# Patient Record
Sex: Female | Born: 1985
Health system: Southern US, Community
[De-identification: ages and names within clinical notes are randomized; demographics above are authoritative.]

## PROBLEM LIST (undated history)

## (undated) ENCOUNTER — Inpatient Hospital Stay (HOSPITAL_COMMUNITY): Payer: Self-pay

## (undated) DIAGNOSIS — D509 Iron deficiency anemia, unspecified: Secondary | ICD-10-CM

## (undated) DIAGNOSIS — J45909 Unspecified asthma, uncomplicated: Secondary | ICD-10-CM

## (undated) DIAGNOSIS — N390 Urinary tract infection, site not specified: Secondary | ICD-10-CM

## (undated) DIAGNOSIS — K219 Gastro-esophageal reflux disease without esophagitis: Secondary | ICD-10-CM

## (undated) DIAGNOSIS — Z8616 Personal history of COVID-19: Secondary | ICD-10-CM

## (undated) HISTORY — DX: Iron deficiency anemia, unspecified: D50.9

## (undated) HISTORY — DX: Gastro-esophageal reflux disease without esophagitis: K21.9

## (undated) HISTORY — DX: Unspecified asthma, uncomplicated: J45.909

## (undated) HISTORY — PX: WISDOM TOOTH EXTRACTION: SHX21

## (undated) HISTORY — DX: Personal history of COVID-19: Z86.16

---
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
## 2009-10-15 ENCOUNTER — Emergency Department (HOSPITAL_COMMUNITY): Admission: EM | Admit: 2009-10-15 | Discharge: 2009-10-15 | Payer: Self-pay | Admitting: Emergency Medicine

## 2010-04-08 ENCOUNTER — Inpatient Hospital Stay (HOSPITAL_COMMUNITY): Admission: AD | Admit: 2010-04-08 | Discharge: 2010-04-08 | Payer: Self-pay | Admitting: Obstetrics & Gynecology

## 2010-06-04 ENCOUNTER — Ambulatory Visit: Payer: Self-pay | Admitting: Nurse Practitioner

## 2010-06-04 ENCOUNTER — Inpatient Hospital Stay (HOSPITAL_COMMUNITY)
Admission: AD | Admit: 2010-06-04 | Discharge: 2010-06-04 | Payer: Self-pay | Source: Home / Self Care | Admitting: Obstetrics and Gynecology

## 2010-08-17 ENCOUNTER — Inpatient Hospital Stay (HOSPITAL_COMMUNITY)
Admission: AD | Admit: 2010-08-17 | Discharge: 2010-08-18 | Payer: Self-pay | Source: Home / Self Care | Attending: Obstetrics and Gynecology | Admitting: Obstetrics and Gynecology

## 2010-08-26 LAB — CBC
HCT: 35.8 % — ABNORMAL LOW (ref 36.0–46.0)
Hemoglobin: 12.1 g/dL (ref 12.0–15.0)
MCH: 24.3 pg — ABNORMAL LOW (ref 26.0–34.0)
MCHC: 33.8 g/dL (ref 30.0–36.0)
MCV: 72 fL — ABNORMAL LOW (ref 78.0–100.0)
Platelets: 149 10*3/uL — ABNORMAL LOW (ref 150–400)
RBC: 4.97 MIL/uL (ref 3.87–5.11)
RDW: 15.4 % (ref 11.5–15.5)
WBC: 14.1 10*3/uL — ABNORMAL HIGH (ref 4.0–10.5)

## 2010-10-22 ENCOUNTER — Other Ambulatory Visit: Payer: Self-pay | Admitting: Obstetrics and Gynecology

## 2010-10-23 LAB — URINALYSIS, ROUTINE W REFLEX MICROSCOPIC
Bilirubin Urine: NEGATIVE
Glucose, UA: NEGATIVE mg/dL
Hgb urine dipstick: NEGATIVE
Ketones, ur: NEGATIVE mg/dL
Nitrite: NEGATIVE
Protein, ur: NEGATIVE mg/dL
Specific Gravity, Urine: 1.02 (ref 1.005–1.030)
Urobilinogen, UA: 0.2 mg/dL (ref 0.0–1.0)
pH: 7 (ref 5.0–8.0)

## 2010-10-23 LAB — CBC
HCT: 36.5 % (ref 36.0–46.0)
Hemoglobin: 12 g/dL (ref 12.0–15.0)
MCH: 25.3 pg — ABNORMAL LOW (ref 26.0–34.0)
MCHC: 32.8 g/dL (ref 30.0–36.0)
MCV: 76.9 fL — ABNORMAL LOW (ref 78.0–100.0)
Platelets: 205 10*3/uL (ref 150–400)
RBC: 4.74 MIL/uL (ref 3.87–5.11)
RDW: 15.1 % (ref 11.5–15.5)
WBC: 11.8 10*3/uL — ABNORMAL HIGH (ref 4.0–10.5)

## 2010-10-23 LAB — URINE MICROSCOPIC-ADD ON

## 2010-10-23 LAB — WET PREP, GENITAL
Clue Cells Wet Prep HPF POC: NONE SEEN
Trich, Wet Prep: NONE SEEN
Yeast Wet Prep HPF POC: NONE SEEN

## 2010-10-23 LAB — GC/CHLAMYDIA PROBE AMP, GENITAL
Chlamydia, DNA Probe: NEGATIVE
GC Probe Amp, Genital: NEGATIVE

## 2010-10-23 LAB — HCG, QUANTITATIVE, PREGNANCY: hCG, Beta Chain, Quant, S: 42726 m[IU]/mL — ABNORMAL HIGH (ref <5)

## 2010-10-23 LAB — ABO/RH: ABO/RH(D): O POS

## 2010-11-05 ENCOUNTER — Inpatient Hospital Stay (HOSPITAL_COMMUNITY)
Admission: AD | Admit: 2010-11-05 | Discharge: 2010-11-07 | DRG: 372 | Disposition: A | Discharge status: Home / Self Care | Payer: BC Managed Care – PPO | Source: Ambulatory Visit | Attending: Obstetrics and Gynecology | Admitting: Obstetrics and Gynecology

## 2010-11-05 DIAGNOSIS — O4290 Premature rupture of membranes, unspecified as to length of time between rupture and onset of labor, unspecified weeks of gestation: Secondary | ICD-10-CM | POA: Diagnosis present

## 2010-11-05 LAB — CBC
HCT: 36.6 % (ref 36.0–46.0)
Hemoglobin: 12.1 g/dL (ref 12.0–15.0)
MCH: 23.6 pg — ABNORMAL LOW (ref 26.0–34.0)
MCHC: 33.1 g/dL (ref 30.0–36.0)
MCV: 71.5 fL — ABNORMAL LOW (ref 78.0–100.0)
Platelets: 203 10*3/uL (ref 150–400)
RBC: 5.12 MIL/uL — ABNORMAL HIGH (ref 3.87–5.11)
RDW: 15.8 % — ABNORMAL HIGH (ref 11.5–15.5)
WBC: 13.5 10*3/uL — ABNORMAL HIGH (ref 4.0–10.5)

## 2010-11-05 LAB — RPR: RPR Ser Ql: NONREACTIVE

## 2010-11-06 LAB — CBC
HCT: 33.7 % — ABNORMAL LOW (ref 36.0–46.0)
Hemoglobin: 11.2 g/dL — ABNORMAL LOW (ref 12.0–15.0)
MCH: 23.6 pg — ABNORMAL LOW (ref 26.0–34.0)
MCHC: 33.2 g/dL (ref 30.0–36.0)
MCV: 70.9 fL — ABNORMAL LOW (ref 78.0–100.0)
Platelets: 179 10*3/uL (ref 150–400)
RBC: 4.75 MIL/uL (ref 3.87–5.11)
RDW: 15.8 % — ABNORMAL HIGH (ref 11.5–15.5)
WBC: 18.1 10*3/uL — ABNORMAL HIGH (ref 4.0–10.5)

## 2010-11-08 NOTE — Discharge Summary (Signed)
  NAMERACHAEL, Emily Mason                ACCOUNT NO.:  [id number]  MEDICAL RECORD NO.:  [id number]           PATIENT TYPE:  I  LOCATION:  9133                          FACILITY:  [facility]  PHYSICIAN:  Gerrit Friends. Aldona Bar, M.D.   DATE OF BIRTH:  1985-09-05  DATE OF ADMISSION:  11/05/2010 DATE OF DISCHARGE:  11/07/2010                              DISCHARGE SUMMARY   DISCHARGE DIAGNOSES: 1. A 37-week intrauterine pregnancy, delivered 7 pounds 15 ounces female     infant, Apgars 8 and 9. 2. Blood type O positive. 3. Obesity.  PROCEDURES: 1. Vacuum extraction assisted delivery. 2. Third-degree tear and repair.  SUMMARY:  This 25 year old gravida 2, para 0 with a due date of November 25, 2010, was admitted after ruptured membranes and a relatively uncomplicated pregnancy.  She had her labor augmented and progressed, but during the second stage because of some persistent bradycardia, required a vacuum extraction assisted delivery and was delivered viablemale infant weighing 7 pounds 15 ounces with Apgars of 8 and 9, however, third-degree tear which was repaired without difficulty.  Her postpartum course was benign.  Her discharge hemoglobin was 11.2 with a white count of 18,100, and her platelet count of 179,000.  On the morning of November 07, 2010, she was ambulating well, tolerating a regular diet well, having normal bowel and bladder function, was afebrile.  Her breast- feeding was going well.  Her vital signs were stable and she was desirous of discharge.  Accordingly, she was given all appropriate instructions per discharge brochure and understood all instructions well.  Discharge medications include vitamins 1 a day and she was given prescriptions for Motrin 600 mg to use every 6 hours as needed for cramping or mild pain and Tylox 1-2 every 4-6 hours as needed for more severe pain.  She will return to the office in 4 weeks' time for followup.  She did not desire circumcision for the  baby.  CONDITION ON DISCHARGE:  Improved.     Gerrit Friends. Aldona Bar, M.D.     RMW/MEDQ  D:  11/07/2010  T:  11/07/2010  Job:  [id number]  Electronically Signed by Annamaria Helling M.D. on 11/08/2010 09:04:44 AM

## 2010-12-04 ENCOUNTER — Other Ambulatory Visit: Payer: Self-pay | Admitting: Obstetrics & Gynecology

## 2011-07-26 ENCOUNTER — Emergency Department (INDEPENDENT_AMBULATORY_CARE_PROVIDER_SITE_OTHER)
Admission: EM | Admit: 2011-07-26 | Discharge: 2011-07-26 | Disposition: A | Discharge status: Home / Self Care | Payer: BC Managed Care – PPO | Source: Home / Self Care | Attending: Family Medicine | Admitting: Family Medicine

## 2011-07-26 DIAGNOSIS — R6889 Other general symptoms and signs: Secondary | ICD-10-CM

## 2011-07-26 MED ORDER — ACETAMINOPHEN 325 MG PO TABS
ORAL_TABLET | ORAL | Status: AC
  Filled 2011-07-26: qty 3

## 2011-07-26 MED ORDER — ONDANSETRON HCL 4 MG PO TABS: 4.0000 mg | ORAL_TABLET | Freq: Three times a day (TID) | ORAL | Status: AC | PRN

## 2011-07-26 MED ORDER — IBUPROFEN 600 MG PO TABS: 600.0000 mg | ORAL_TABLET | Freq: Three times a day (TID) | ORAL | Status: AC | PRN

## 2011-07-26 MED ORDER — HYDROCODONE-ACETAMINOPHEN 7.5-325 MG/15ML PO SOLN: 15.0000 mL | Freq: Three times a day (TID) | ORAL | Status: AC | PRN

## 2011-07-26 MED ORDER — ACETAMINOPHEN 500 MG PO TABS
1000.0000 mg | ORAL_TABLET | Freq: Once | ORAL | Status: AC
  Administered 2011-07-26: 1000 mg via ORAL

## 2011-07-26 NOTE — ED Notes (Signed)
Pt is breastfeeding

## 2011-07-26 NOTE — ED Notes (Signed)
Pt has fever, cough, congestion and bodyaches since 07-24-11

## 2011-07-26 NOTE — ED Provider Notes (Signed)
History     CSN: [id number] Arrival date & time: 07/26/2011  9:25 AM   First MD Initiated Contact with Patient 07/26/11 [phone]      Chief Complaint  Patient presents with  . Fever    (Consider location/radiation/quality/duration/timing/severity/associated sxs/prior treatment) HPI Comments: No significant PMH 8 months post-partum. Here c/o fever, body aches cogh congestion for 2 days. Has had nausea, no vomitting or diarrhea. Decreased apetite but drinking fluids well, no chest pain.     History reviewed. No pertinent past medical history.  Past Surgical History  Procedure Date  . Wisdom tooth extraction     History reviewed. No pertinent family history.  History  Substance Use Topics  . Smoking status: Never Smoker   . Smokeless tobacco: Not on file  . Alcohol Use: No    OB History    Grav Para Term Preterm Abortions TAB SAB Ect Mult Living                  Review of Systems  Constitutional: Positive for fever, chills and appetite change.  HENT: Positive for congestion and rhinorrhea. Negative for sore throat, neck pain, neck stiffness and sinus pressure.   Eyes: Negative for discharge.  Respiratory: Positive for cough. Negative for chest tightness, shortness of breath and wheezing.   Gastrointestinal: Positive for nausea. Negative for vomiting, abdominal pain and diarrhea.  Genitourinary: Negative for dysuria, frequency and hematuria.  Musculoskeletal: Positive for myalgias.  Skin: Negative for rash.  Neurological: Positive for headaches.    Allergies  Review of patient's allergies indicates no known allergies.  Home Medications   Current Outpatient Rx  Name Route Sig Dispense Refill  . ASPIRIN EFFERVESCENT 325 MG PO TBEF Oral Take 325 mg by mouth every 6 (six) hours as needed.      Marland Kitchen HYDROCODONE-ACETAMINOPHEN 7.5-325 MG/15ML PO SOLN Oral Take 15 mLs by mouth every 8 (eight) hours as needed for pain (or cough). 120 mL 0  . IBUPROFEN 600 MG PO TABS Oral  Take 1 tablet (600 mg total) by mouth every 8 (eight) hours as needed for pain or fever. 20 tablet 0    Take with food  . ONDANSETRON HCL 4 MG PO TABS Oral Take 1 tablet (4 mg total) by mouth every 8 (eight) hours as needed for nausea. 10 tablet 0    BP 134/99  Pulse 131  Temp(Src) 100.6 F (38.1 C) (Oral)  Resp 18  SpO2 99%  LMP 07/13/2011  Physical Exam  Nursing note and vitals reviewed. Constitutional: She is oriented to person, place, and time. She appears well-developed and well-nourished. No distress.  HENT:  Head: Normocephalic and atraumatic.  Mouth/Throat: No oropharyngeal exudate.       Nasal Congestion with erythema and swelling of nasal turbinates, clear rhinorrhea. Pharyngeal erythema no exudates. No uvula deviation. No trismus. TM's normal.  Eyes:       Bilateral conjunctival injection, no blepharitis or discharge.   Neck: Neck supple.  Cardiovascular: Normal rate, regular rhythm and normal heart sounds.   No murmur heard. Pulmonary/Chest: Effort normal and breath sounds normal. No respiratory distress. She has no wheezes. She has no rales. She exhibits no tenderness.  Abdominal: Soft. There is no tenderness.  Musculoskeletal: She exhibits no edema.  Lymphadenopathy:    She has no cervical adenopathy.  Neurological: She is alert and oriented to person, place, and time.  Skin: Skin is warm.    ED Course  Procedures (including critical care time)  Labs Reviewed -  No data to display No results found.   1. Influenza-like illness       MDM          Sharin Grave, MD 07/29/11 [phone]

## 2012-03-27 ENCOUNTER — Encounter (HOSPITAL_COMMUNITY): Payer: Self-pay

## 2012-03-27 ENCOUNTER — Emergency Department (INDEPENDENT_AMBULATORY_CARE_PROVIDER_SITE_OTHER)
Admission: EM | Admit: 2012-03-27 | Discharge: 2012-03-27 | Disposition: A | Discharge status: Home / Self Care | Payer: BC Managed Care – PPO | Source: Home / Self Care

## 2012-03-27 DIAGNOSIS — N898 Other specified noninflammatory disorders of vagina: Secondary | ICD-10-CM

## 2012-03-27 DIAGNOSIS — N390 Urinary tract infection, site not specified: Secondary | ICD-10-CM

## 2012-03-27 LAB — POCT URINALYSIS DIP (DEVICE)
Bilirubin Urine: NEGATIVE
Glucose, UA: NEGATIVE mg/dL
Hgb urine dipstick: NEGATIVE
Ketones, ur: NEGATIVE mg/dL
Nitrite: NEGATIVE
Protein, ur: 30 mg/dL — AB
Specific Gravity, Urine: 1.025 (ref 1.005–1.030)
Urobilinogen, UA: 0.2 mg/dL (ref 0.0–1.0)
pH: 6.5 (ref 5.0–8.0)

## 2012-03-27 LAB — WET PREP, GENITAL
Clue Cells Wet Prep HPF POC: NONE SEEN
Trich, Wet Prep: NONE SEEN

## 2012-03-27 LAB — POCT PREGNANCY, URINE: Preg Test, Ur: NEGATIVE

## 2012-03-27 MED ORDER — CEPHALEXIN 500 MG PO CAPS: 500.0000 mg | ORAL_CAPSULE | Freq: Two times a day (BID) | ORAL | Status: AC

## 2012-03-27 MED ORDER — FLUCONAZOLE 150 MG PO TABS: 150.0000 mg | ORAL_TABLET | Freq: Once | ORAL | Status: AC

## 2012-03-27 NOTE — ED Notes (Signed)
Pt has vaginal itching and burning with thick white discharge.

## 2012-03-27 NOTE — ED Provider Notes (Signed)
History     CSN: [id number]  Arrival date & time 03/27/12  1906   None     Chief Complaint  Patient presents with  . Vaginal Discharge    (Consider location/radiation/quality/duration/timing/severity/associated sxs/prior treatment) Patient is a 26 y.o. female presenting with vaginal discharge. The history is provided by the patient.  Vaginal Discharge  26 y.o. female complains of white thick irritating vaginal discharge for 4 days.  Denies abnormal vaginal bleeding, significant pelvic pain or fever. +dysuria, no flank pain. Sexually active, married, does not use condoms, no change in partner.  Last unprotected intercourse this week.  Denies history of known exposure to STD or symptoms in partner.  Patient's last menstrual period was 03/05/2012.  No history of STD's.  Has not taken medication for symptoms.  History reviewed. No pertinent past medical history.  Past Surgical History  Procedure Date  . Wisdom tooth extraction     History reviewed. No pertinent family history.  History  Substance Use Topics  . Smoking status: Never Smoker   . Smokeless tobacco: Not on file  . Alcohol Use: No    OB History    Grav Para Term Preterm Abortions TAB SAB Ect Mult Living                  Review of Systems  Constitutional: Negative.   Respiratory: Negative.   Cardiovascular: Negative.   Gastrointestinal: Negative.   Genitourinary: Positive for dysuria and vaginal discharge. Negative for urgency, frequency, hematuria, flank pain, decreased urine volume, vaginal bleeding, difficulty urinating, menstrual problem and pelvic pain.  Musculoskeletal: Negative.     Allergies  Review of patient's allergies indicates no known allergies.  Home Medications   Current Outpatient Rx  Name Route Sig Dispense Refill  . ASPIRIN EFFERVESCENT 325 MG PO TBEF Oral Take 325 mg by mouth every 6 (six) hours as needed.      . CEPHALEXIN 500 MG PO CAPS Oral Take 1 capsule (500 mg total) by mouth  2 (two) times daily. 20 capsule 0  . FLUCONAZOLE 150 MG PO TABS Oral Take 1 tablet (150 mg total) by mouth once. Take second dose in 4 days if not improved. 2 tablet 0    BP 151/107  Pulse 79  Temp 97.8 F (36.6 C) (Oral)  Resp 18  SpO2 97%  LMP 03/05/2012  Physical Exam  Nursing note and vitals reviewed. Constitutional: She is oriented to person, place, and time. Vital signs are normal. She appears well-developed and well-nourished. She is active and cooperative.  HENT:  Head: Normocephalic.  Eyes: Conjunctivae are normal. Pupils are equal, round, and reactive to light. No scleral icterus.  Neck: Trachea normal. Neck supple.  Cardiovascular: Normal rate, regular rhythm and normal heart sounds.   Pulmonary/Chest: Effort normal and breath sounds normal.  Abdominal: Soft. Bowel sounds are normal.  Genitourinary: Uterus normal. Pelvic exam was performed with patient supine. No labial fusion. There is no rash, tenderness or lesion on the right labia. There is no rash, tenderness or lesion on the left labia. Cervix exhibits no motion tenderness, no discharge and no friability. Right adnexum displays no mass, no tenderness and no fullness. Left adnexum displays no mass, no tenderness and no fullness. No erythema, tenderness or bleeding around the vagina. No signs of injury around the vagina. Vaginal discharge found.  Lymphadenopathy:    She has no cervical adenopathy.       Right: No inguinal adenopathy present.       Left:  No inguinal adenopathy present.  Neurological: She is alert and oriented to person, place, and time. No cranial nerve deficit or sensory deficit.  Skin: Skin is warm and dry.  Psychiatric: She has a normal mood and affect. Her speech is normal and behavior is normal. Judgment and thought content normal. Cognition and memory are normal.    ED Course  Procedures (including critical care time)  Labs Reviewed  POCT URINALYSIS DIP (DEVICE) - Abnormal; Notable for the  following:    Protein, ur 30 (*)     Leukocytes, UA MODERATE (*)  Biochemical Testing Only. Please order routine urinalysis from main lab if confirmatory testing is needed.   All other components within normal limits  WET PREP, GENITAL - Abnormal; Notable for the following:    Yeast Wet Prep HPF POC FEW YEAST (*)     WBC, Wet Prep HPF POC MANY (*)     All other components within normal limits  POCT PREGNANCY, URINE  GC/CHLAMYDIA PROBE AMP, GENITAL   No results found.   1. UTI (lower urinary tract infection)   2. Vaginal Discharge       MDM  Await GC/CT culture results, take antibiotics as prescribed.          Johnsie Kindred, NP 03/27/12 2129

## 2012-03-28 NOTE — ED Provider Notes (Signed)
Medical screening examination/treatment/procedure(s) were performed by non-physician practitioner and as supervising physician I was immediately available for consultation/collaboration.  Leslee Home, M.D.   Reuben Likes, MD 03/28/12 [phone]

## 2012-03-30 LAB — GC/CHLAMYDIA PROBE AMP, GENITAL
Chlamydia, DNA Probe: NEGATIVE
GC Probe Amp, Genital: NEGATIVE

## 2013-04-25 ENCOUNTER — Inpatient Hospital Stay (HOSPITAL_COMMUNITY): Payer: BC Managed Care – PPO

## 2013-04-25 ENCOUNTER — Inpatient Hospital Stay (HOSPITAL_COMMUNITY)
Admission: AD | Admit: 2013-04-25 | Discharge: 2013-04-25 | Disposition: A | Discharge status: Home / Self Care | Payer: BC Managed Care – PPO | Source: Ambulatory Visit | Attending: Obstetrics and Gynecology | Admitting: Obstetrics and Gynecology

## 2013-04-25 ENCOUNTER — Encounter (HOSPITAL_COMMUNITY): Payer: Self-pay | Admitting: *Deleted

## 2013-04-25 DIAGNOSIS — N831 Corpus luteum cyst of ovary, unspecified side: Secondary | ICD-10-CM | POA: Insufficient documentation

## 2013-04-25 DIAGNOSIS — N888 Other specified noninflammatory disorders of cervix uteri: Secondary | ICD-10-CM

## 2013-04-25 DIAGNOSIS — O34599 Maternal care for other abnormalities of gravid uterus, unspecified trimester: Secondary | ICD-10-CM | POA: Insufficient documentation

## 2013-04-25 DIAGNOSIS — R109 Unspecified abdominal pain: Secondary | ICD-10-CM | POA: Insufficient documentation

## 2013-04-25 DIAGNOSIS — O26851 Spotting complicating pregnancy, first trimester: Secondary | ICD-10-CM

## 2013-04-25 DIAGNOSIS — O26859 Spotting complicating pregnancy, unspecified trimester: Secondary | ICD-10-CM | POA: Insufficient documentation

## 2013-04-25 HISTORY — DX: Urinary tract infection, site not specified: N39.0

## 2013-04-25 LAB — CBC
HCT: 32.8 % — ABNORMAL LOW (ref 36.0–46.0)
MCV: 65.7 fL — ABNORMAL LOW (ref 78.0–100.0)
Platelets: 202 10*3/uL (ref 150–400)
RBC: 4.99 MIL/uL (ref 3.87–5.11)
RDW: 18 % — ABNORMAL HIGH (ref 11.5–15.5)
WBC: 9.9 10*3/uL (ref 4.0–10.5)

## 2013-04-25 LAB — URINALYSIS, ROUTINE W REFLEX MICROSCOPIC
Bilirubin Urine: NEGATIVE
Hgb urine dipstick: NEGATIVE
Ketones, ur: NEGATIVE mg/dL
Nitrite: NEGATIVE
Specific Gravity, Urine: 1.01 (ref 1.005–1.030)
Urobilinogen, UA: 0.2 mg/dL (ref 0.0–1.0)

## 2013-04-25 LAB — URINE MICROSCOPIC-ADD ON

## 2013-04-25 LAB — WET PREP, GENITAL
Clue Cells Wet Prep HPF POC: NONE SEEN
Trich, Wet Prep: NONE SEEN

## 2013-04-25 LAB — HCG, QUANTITATIVE, PREGNANCY: hCG, Beta Chain, Quant, S: 9357 m[IU]/mL — ABNORMAL HIGH (ref <5)

## 2013-04-25 NOTE — MAU Note (Addendum)
PT SAYS SHE STARTED HAVING CRAMPING   LAST Tuesday-- CALLED DR-   HAS AN APPOINTMENT  ON 9-29 WITH DR Arlyce Dice.    STARTED SPOTTING ON WED- CALLED OFFICE-  TOLD TO WATCH.  SPOTTING STOPPED- UNTIL TODAY AT  9AM- HAD SPOTTING WHEN SHE WIPED-  THEN NO MORE.   NOW IN TRIAGE- NO CRAMPING  AND NO BLEEDING.   LAST SEX-  AUG.   DID HOME PREG TEST ON 8-25- POSTIVE.     NO BIRTH CONTROL.

## 2013-04-25 NOTE — MAU Provider Note (Signed)
Chief Complaint: No chief complaint on file.  First Provider Initiated Contact with Patient 04/25/13 2323    SUBJECTIVE HPI: Emily Mason is a 27 y.o. G3P1011 at [redacted]w[redacted]d by LMP who presents with intermittent cramping and spotting x5 days. Has appointment [facility] on 9/29. LMP 02/28/2013. Positive home pregnancy test 04/04/2013. No other testing this pregnancy. Last intercourse one week ago.  Blood type O+.  Past Medical History  Diagnosis Date  . UTI (lower urinary tract infection)    OB History  Gravida Para Term Preterm AB SAB TAB Ectopic Multiple Living  3 1 1  1 1    1     # Outcome Date GA Lbr Len/2nd Weight Sex Delivery Anes PTL Lv  3 CUR           2 TRM     M SVD   Y  1 SAB              Past Surgical History  Procedure Laterality Date  . Wisdom tooth extraction     History   Social History  . Marital Status: Married    Spouse Name: N/A    Number of Children: N/A  . Years of Education: N/A   Occupational History  . Not on file.   Social History Main Topics  . Smoking status: Never Smoker   . Smokeless tobacco: Not on file  . Alcohol Use: No  . Drug Use: No  . Sexual Activity: Yes   Other Topics Concern  . Not on file   Social History Narrative  . No narrative on file   No current facility-administered medications on file prior to encounter.   No current outpatient prescriptions on file prior to encounter.   No Known Allergies  ROS: Pertinent positive items in HPI.  OBJECTIVE Blood pressure 115/70, pulse 90, temperature 98.9 F (37.2 C), temperature source Oral, resp. rate 18, height 5\' 3"  (1.6 m), weight 86.354 kg (190 lb 6 oz), last menstrual period 02/28/2013. GENERAL: Well-developed, well-nourished female in no acute distress.  HEENT: Normocephalic HEART: normal rate RESP: normal effort ABDOMEN: Soft, non-tender. Negative CVA tenderness. EXTREMITIES: Nontender, no edema NEURO: Alert and oriented SPECULUM EXAM: NEFG, physiologic  discharge, scant blood noted oozing from surface of cervix where touched by speculum were swab, cervix friable. BIMANUAL: cervix closed; uterus 8-9 week size, no adnexal tenderness or masses  LAB RESULTS Results for orders placed during the hospital encounter of 04/25/13 (from the past 24 hour(s))  URINALYSIS, ROUTINE W REFLEX MICROSCOPIC     Status: Abnormal   Collection Time    04/25/13  7:40 PM      Result Value Range   Color, Urine STRAW (*) YELLOW   APPearance CLEAR  CLEAR   Specific Gravity, Urine 1.010  1.005 - 1.030   pH 6.5  5.0 - 8.0   Glucose, UA NEGATIVE  NEGATIVE mg/dL   Hgb urine dipstick NEGATIVE  NEGATIVE   Bilirubin Urine NEGATIVE  NEGATIVE   Ketones, ur NEGATIVE  NEGATIVE mg/dL   Protein, ur NEGATIVE  NEGATIVE mg/dL   Urobilinogen, UA 0.2  0.0 - 1.0 mg/dL   Nitrite NEGATIVE  NEGATIVE   Leukocytes, UA LARGE (*) NEGATIVE  URINE MICROSCOPIC-ADD ON     Status: None   Collection Time    04/25/13  7:40 PM      Result Value Range   Squamous Epithelial / LPF RARE  RARE   WBC, UA 7-10  <3 WBC/hpf  Bacteria, UA RARE  RARE  POCT PREGNANCY, URINE     Status: Abnormal   Collection Time    04/25/13  8:01 PM      Result Value Range   Preg Test, Ur POSITIVE (*) NEGATIVE  HCG, QUANTITATIVE, PREGNANCY     Status: Abnormal   Collection Time    04/25/13  9:00 PM      Result Value Range   hCG, Beta Chain, Quant, S 9357 (*) <5 mIU/mL  CBC     Status: Abnormal   Collection Time    04/25/13  9:00 PM      Result Value Range   WBC 9.9  4.0 - 10.5 K/uL   RBC 4.99  3.87 - 5.11 MIL/uL   Hemoglobin 10.9 (*) 12.0 - 15.0 g/dL   HCT 78.2 (*) 95.6 - 21.3 %   MCV 65.7 (*) 78.0 - 100.0 fL   MCH 21.8 (*) 26.0 - 34.0 pg   MCHC 33.2  30.0 - 36.0 g/dL   RDW 08.6 (*) 57.8 - 46.9 %   Platelets 202  150 - 400 K/uL    IMAGING US Ob Comp Less 14 Wks  04/25/2013   CLINICAL DATA:  Patient pregnant with cramping and spotting.  EXAM: OBSTETRIC <14 WK ULTRASOUND  TECHNIQUE: Transabdominal  ultrasound was performed for evaluation of the gestation as well as the maternal uterus and adnexal regions.  COMPARISON:  None.  FINDINGS: Intrauterine gestational sac: Visualized/normal in shape.  Yolk sac:  Visualized  Embryo:  Small embryo is visualized  Cardiac Activity: Cardiac activity was detected  Heart Rate: 153 bpm  MSD:   mm    w     d  CRL:   13.6  mm   7 w 5 d                  [place] EDC: 12/07/2013  Maternal uterus/adnexae: Small corpus luteum arises from the left ovary. The ovaries are otherwise unremarkable. No adnexal masses. No pelvic free fluid.  IMPRESSION: Single live intrauterine pregnancy with a measured gestational age of [redacted] weeks and 5 days.  No emergent fetal or maternal complication.   Electronically Signed   By: Amie Portland   On: 04/25/2013 21:53    MAU COURSE  ASSESSMENT 1. Spotting in first trimester   2. Friable cervix   3. Corpus luteum cyst    live  IUP.  PLAN Discharge home in stable condition. GC Chlamydia, wet prep pending. Wet prep scratch that pelvic rest x1 week.     Follow-up Information   Follow up with [facility] On 05/09/2013. (As scheduled or as needed if symptoms worsen)    Contact information:   [address], [address] [place] [address] [phone]      Follow up with [facility] [place] [facility]. (As needed if symptoms worsen)    Contact information:   [address] [id number] [place] [address] [phone]       Medication List         prenatal multivitamin Tabs tablet  Take 1 tablet by mouth at bedtime.         [place], [place] 04/25/2013  11:23 PM

## 2013-05-09 ENCOUNTER — Other Ambulatory Visit: Payer: Self-pay

## 2014-02-28 ENCOUNTER — Encounter (HOSPITAL_COMMUNITY): Payer: Self-pay | Admitting: *Deleted

## 2014-06-12 ENCOUNTER — Encounter (HOSPITAL_COMMUNITY): Payer: Self-pay | Admitting: *Deleted

## 2014-08-11 NOTE — L&D Delivery Note (Signed)
Patient was C/C/+2 and pushed for 12 minutes with epidural.   NSVD  female infant, Apgars 8,9, weight 8#11.   The patient had a second degree episiotomy done to relieve maternal pressure repaired with 2-0 vicryl R. Fundus was firm. EBL was expected amount. Placenta was delivered intact. Vagina was clear.  Baby was vigorous and doing skin to skin with mother.  Darly Massi A

## 2014-10-18 ENCOUNTER — Other Ambulatory Visit: Payer: Self-pay | Admitting: Obstetrics and Gynecology

## 2014-10-18 LAB — OB RESULTS CONSOLE RPR: RPR: NONREACTIVE

## 2014-10-18 LAB — OB RESULTS CONSOLE HIV ANTIBODY (ROUTINE TESTING): HIV: NONREACTIVE

## 2014-10-18 LAB — OB RESULTS CONSOLE RUBELLA ANTIBODY, IGM: RUBELLA: IMMUNE

## 2014-10-18 LAB — OB RESULTS CONSOLE ABO/RH

## 2014-10-18 LAB — OB RESULTS CONSOLE HEPATITIS B SURFACE ANTIGEN: Hepatitis B Surface Ag: NEGATIVE

## 2014-10-18 LAB — OB RESULTS CONSOLE GC/CHLAMYDIA
CHLAMYDIA, DNA PROBE: NEGATIVE
GC PROBE AMP, GENITAL: NEGATIVE

## 2014-10-19 LAB — CYTOLOGY - PAP

## 2014-11-27 ENCOUNTER — Inpatient Hospital Stay (HOSPITAL_COMMUNITY): Payer: BLUE CROSS/BLUE SHIELD

## 2014-11-27 ENCOUNTER — Encounter (HOSPITAL_COMMUNITY): Payer: Self-pay | Admitting: *Deleted

## 2014-11-27 ENCOUNTER — Inpatient Hospital Stay (HOSPITAL_COMMUNITY)
Admission: AD | Admit: 2014-11-27 | Discharge: 2014-11-27 | Disposition: A | Discharge status: Home / Self Care | Payer: BLUE CROSS/BLUE SHIELD | Source: Ambulatory Visit | Attending: Obstetrics and Gynecology | Admitting: Obstetrics and Gynecology

## 2014-11-27 DIAGNOSIS — O209 Hemorrhage in early pregnancy, unspecified: Secondary | ICD-10-CM | POA: Insufficient documentation

## 2014-11-27 DIAGNOSIS — Z3A13 13 weeks gestation of pregnancy: Secondary | ICD-10-CM | POA: Diagnosis not present

## 2014-11-27 DIAGNOSIS — N939 Abnormal uterine and vaginal bleeding, unspecified: Secondary | ICD-10-CM | POA: Diagnosis present

## 2014-11-27 LAB — URINALYSIS, ROUTINE W REFLEX MICROSCOPIC
Bilirubin Urine: NEGATIVE
GLUCOSE, UA: NEGATIVE mg/dL
Ketones, ur: NEGATIVE mg/dL
Nitrite: NEGATIVE
Protein, ur: NEGATIVE mg/dL
SPECIFIC GRAVITY, URINE: 1.02 (ref 1.005–1.030)
Urobilinogen, UA: 0.2 mg/dL (ref 0.0–1.0)
pH: 6 (ref 5.0–8.0)

## 2014-11-27 LAB — POCT PREGNANCY, URINE: Preg Test, Ur: POSITIVE — AB

## 2014-11-27 LAB — URINE MICROSCOPIC-ADD ON

## 2014-11-27 NOTE — MAU Note (Signed)
Urine in lab 

## 2014-11-27 NOTE — Discharge Instructions (Signed)
Pelvic Rest Pelvic rest is sometimes recommended for women when:   The placenta is partially or completely covering the opening of the cervix (placenta previa).  There is bleeding between the uterine wall and the amniotic sac in the first trimester (subchorionic hemorrhage).  The cervix begins to open without labor starting (incompetent cervix, cervical insufficiency).  The labor is too early (preterm labor). HOME CARE INSTRUCTIONS  Do not have sexual intercourse, stimulation, or an orgasm.  Do not use tampons, douche, or put anything in the vagina.  Do not lift anything over 10 pounds (4.5 kg).  Avoid strenuous activity or straining your pelvic muscles. SEEK MEDICAL CARE IF:  You have any vaginal bleeding during pregnancy. Treat this as a potential emergency.  You have cramping pain felt low in the stomach (stronger than menstrual cramps).  You notice vaginal discharge (watery, mucus, or bloody).  You have a low, dull backache.  There are regular contractions or uterine tightening. SEEK IMMEDIATE MEDICAL CARE IF: You have vaginal bleeding and have placenta previa.  Document Released: 11/22/2010 Document Revised: 10/20/2011 Document Reviewed: 11/22/2010 [facility] LLCExitCare Patient Information 2015 [place], [place]. This information is not intended to replace advice given to you by your health care provider. Make sure you discuss any questions you have with your health care provider.  Vaginal Bleeding During Pregnancy, Second Trimester A small amount of bleeding (spotting) from the vagina is relatively common in pregnancy. It usually stops on its own. Various things can cause bleeding or spotting in pregnancy. Some bleeding may be related to the pregnancy, and some may not. Sometimes the bleeding is normal and is not a problem. However, bleeding can also be a sign of something serious. Be sure to tell your health care provider about any vaginal bleeding right away. Some possible causes of  vaginal bleeding during the second trimester include:  Infection, inflammation, or growths on the cervix.   The placenta may be partially or completely covering the opening of the cervix inside the uterus (placenta previa).  The placenta may have separated from the uterus (abruption of the placenta).   You may be having early (preterm) labor.   The cervix may not be strong enough to keep a baby inside the uterus (cervical insufficiency).   Tiny cysts may have developed in the uterus instead of pregnancy tissue (molar pregnancy). HOME CARE INSTRUCTIONS  Watch your condition for any changes. The following actions may help to lessen any discomfort you are feeling:  Follow your health care provider's instructions for limiting your activity. If your health care provider orders bed rest, you may need to stay in bed and only get up to use the bathroom. However, your health care provider may allow you to continue light activity.  If needed, make plans for someone to help with your regular activities and responsibilities while you are on bed rest.  Keep track of the number of pads you use each day, how often you change pads, and how soaked (saturated) they are. Write this down.  Do not use tampons. Do not douche.  Do not have sexual intercourse or orgasms until approved by your health care provider.  If you pass any tissue from your vagina, save the tissue so you can show it to your health care provider.  Only take over-the-counter or prescription medicines as directed by your health care provider.  Do not take aspirin because it can make you bleed.  Do not exercise or perform any strenuous activities or heavy lifting without your  health care provider's permission.  Keep all follow-up appointments as directed by your health care provider. SEEK MEDICAL CARE IF:  You have any vaginal bleeding during any part of your pregnancy.  You have cramps or labor pains.  You have a fever, not  controlled by medicine. SEEK IMMEDIATE MEDICAL CARE IF:   You have severe cramps in your back or belly (abdomen).  You have contractions.  You have chills.  You pass large clots or tissue from your vagina.  Your bleeding increases.  You feel light-headed or weak, or you have fainting episodes.  You are leaking fluid or have a gush of fluid from your vagina. MAKE SURE YOU:  Understand these instructions.  Will watch your condition.  Will get help right away if you are not doing well or get worse. Document Released: 05/07/2005 Document Revised: 08/02/2013 Document Reviewed: 04/04/2013 [facility]Exit[facility] Patient Information 2015 [place], [place]LL[place]. This information is not intended to replace advice given to you by your health care provider. Make sure you discuss any questions you have with your health care provider.

## 2014-11-27 NOTE — MAU Note (Signed)
C/o bleeding since 0900 this AM;

## 2014-11-27 NOTE — MAU Provider Note (Signed)
History     CSN: [id number]  Arrival date and time: 11/27/14 1354   First Provider Initiated Contact with Patient 11/27/14 1526      Chief Complaint  Patient presents with  . Vaginal Bleeding   Vaginal Bleeding The patient's primary symptoms include vaginal bleeding. The patient's pertinent negatives include no genital itching, genital lesions or pelvic pain. This is a new problem. The current episode started today. The problem has been gradually improving. The patient is experiencing no pain. She is pregnant. Pertinent negatives include no abdominal pain, back pain, chills, dysuria, fever, nausea, painful intercourse or vomiting. The vaginal discharge was bloody. The vaginal bleeding is lighter than menses (initial "gush" then light). She has not been passing clots. She has not been passing tissue. Nothing aggravates the symptoms. She has tried nothing for the symptoms. She uses nothing for contraception.   THis is a 29 y.o. female at [redacted]w[redacted]d who presents with c/o bleeding which started today at 9am. Was initially heavier and soaked 2 small pads, and is now light.  Bleeding was red, now light red. Denies cramping.  Has had two ultrasounds in office demonstrating SIUP.  Nuchal translucency [place] was done recently and was deemed normal.  Denies recent trauma or intercourse.  Medical, surgical, family and social history reviewed.   RN Note:  Expand All Collapse All   Started bleeding around 0900 this a.m., soaked 2 panti liners, now wearing a pad. Mild cramping.         OB History    Gravida Para Term Preterm AB TAB SAB Ectopic Multiple Living   Past Medical History  Diagnosis Date  . UTI (lower urinary tract infection)     Past Surgical History  Procedure Laterality Date  . Wisdom tooth extraction      History reviewed. No pertinent family history.  History  Substance Use Topics  . Smoking status: Never Smoker   . Smokeless tobacco: Not on file  .  Alcohol Use: No    Allergies: No Known Allergies  Prescriptions prior to admission  Medication Sig Dispense Refill Last Dose  . calcium carbonate (TUMS - DOSED IN MG ELEMENTAL CALCIUM) 500 MG chewable tablet Chew 1 tablet by mouth daily as needed for indigestion or heartburn.   Past Week at Unknown time  . IRON PO Take 1 tablet by mouth daily.   11/27/2014 at Unknown time  . Prenatal Vit-Fe Fumarate-FA (PRENATAL MULTIVITAMIN) TABS tablet Take 1 tablet by mouth at bedtime.   11/26/2014 at Unknown time    Review of Systems  Constitutional: Negative for fever, chills and malaise/fatigue.  Gastrointestinal: Negative for nausea, vomiting and abdominal pain.  Genitourinary: Positive for vaginal bleeding. Negative for dysuria and pelvic pain.  Musculoskeletal: Negative for back pain.  Neurological: Negative for dizziness.   Physical Exam   Blood pressure 119/78, pulse 98, temperature 98.6 F (37 C), temperature source Oral, resp. rate 18, height  (1.626 m), weight 199 lb (90.266 kg), unknown if currently breastfeeding.  Physical Exam  Constitutional: She is oriented to person, place, and time. She appears well-developed and well-nourished. No distress.  HENT:  Head: Normocephalic.  Cardiovascular: Normal rate.   Respiratory: Effort normal.  GI: Soft. She exhibits no distension and no mass. There is no tenderness. There is no rebound and no guarding.  Genitourinary: Vaginal discharge found.  Speculum exam:  Small amount of blood  Cervix long  and closed No active bleeding  Musculoskeletal: Normal range of motion.  Neurological: She is alert and oriented to person, place, and time.  Skin: Skin is warm and dry.  Psychiatric: She has a normal mood and affect.   Fetal heart tones audible with doppler  MAU Course  Procedures  MDM Consulted Dr Dareen PianoAnderson Blood Type O+ Will check ultrasound  >>  Normal IUP, FHR 165, no subchorionic hemorrhage   Assessment and Plan  A:  SIUP at  6364w0d        Second trimester bleeding of unknown origin  P:  Reported to Dr Dareen PianoAnderson       Discharge home       Pelvic rest        Followup in office as scheduled for prenatal care. Notify office if bleeding increases or becomes associated with pain.   [facility] AscWILLIAMS,Grizel Vesely 11/27/2014, 3:26 PM

## 2014-11-27 NOTE — MAU Note (Signed)
Started bleeding around 0900 this a.m., soaked 2 panti liners, now wearing a pad.  Mild cramping.

## 2014-12-03 ENCOUNTER — Encounter (HOSPITAL_COMMUNITY): Payer: Self-pay | Admitting: Advanced Practice Midwife

## 2014-12-03 DIAGNOSIS — O209 Hemorrhage in early pregnancy, unspecified: Secondary | ICD-10-CM | POA: Insufficient documentation

## 2015-05-08 ENCOUNTER — Other Ambulatory Visit: Payer: Self-pay

## 2015-05-08 LAB — OB RESULTS CONSOLE GBS: STREP GROUP B AG: NEGATIVE

## 2015-05-13 ENCOUNTER — Inpatient Hospital Stay (HOSPITAL_COMMUNITY)
Admission: AD | Admit: 2015-05-13 | Discharge: 2015-05-13 | Disposition: A | Discharge status: Home / Self Care | Payer: BLUE CROSS/BLUE SHIELD | Source: Ambulatory Visit | Attending: Obstetrics and Gynecology | Admitting: Obstetrics and Gynecology

## 2015-05-13 ENCOUNTER — Encounter (HOSPITAL_COMMUNITY): Payer: Self-pay

## 2015-05-13 DIAGNOSIS — Z3A37 37 weeks gestation of pregnancy: Secondary | ICD-10-CM | POA: Insufficient documentation

## 2015-05-13 DIAGNOSIS — O368130 Decreased fetal movements, third trimester, not applicable or unspecified: Secondary | ICD-10-CM

## 2015-05-13 LAB — URINE MICROSCOPIC-ADD ON

## 2015-05-13 LAB — URINALYSIS, ROUTINE W REFLEX MICROSCOPIC
BILIRUBIN URINE: NEGATIVE
GLUCOSE, UA: NEGATIVE mg/dL
HGB URINE DIPSTICK: NEGATIVE
KETONES UR: NEGATIVE mg/dL
NITRITE: NEGATIVE
PH: 7 (ref 5.0–8.0)
Protein, ur: NEGATIVE mg/dL
Specific Gravity, Urine: 1.015 (ref 1.005–1.030)
Urobilinogen, UA: 0.2 mg/dL (ref 0.0–1.0)

## 2015-05-13 NOTE — Discharge Instructions (Signed)

## 2015-05-13 NOTE — MAU Note (Signed)
Patient states little or no fetal movement since this morning with non painful tightening.

## 2015-05-13 NOTE — MAU Provider Note (Signed)
History     CSN: [id number]  Arrival date and time: 05/13/15 1811   First Provider Initiated Contact with Patient 05/13/15 1847         Chief Complaint  Patient presents with  . Decreased Fetal Movement   HPI  Emily Mason is a 29 y.o. [id number] at [redacted]w[redacted]d who presents for decreased fetal movement. Reports decreased movement since this morning. Denies vaginal bleeding or LOF. Some intermittent abdominal tightening, no pain.  Denies complications with pregnancy.  .   OB History    Gravida Para Term Preterm AB TAB SAB Ectopic Multiple Living   Past Medical History  Diagnosis Date  . UTI (lower urinary tract infection)     Past Surgical History  Procedure Laterality Date  . Wisdom tooth extraction      Family History  Problem Relation Age of Onset  . Alcohol abuse Neg Hx   . Arthritis Neg Hx   . Asthma Neg Hx   . Birth defects Neg Hx   . Cancer Neg Hx   . COPD Neg Hx   . Depression Neg Hx   . Diabetes Neg Hx   . Drug abuse Neg Hx   . Early death Neg Hx   . Hearing loss Neg Hx   . Heart disease Neg Hx   . Hyperlipidemia Neg Hx   . Hypertension Neg Hx   . Kidney disease Neg Hx   . Learning disabilities Neg Hx   . Mental illness Neg Hx   . Mental retardation Neg Hx   . Miscarriages / Stillbirths Neg Hx   . Vision loss Neg Hx   . Stroke Neg Hx   . Varicose Veins Neg Hx     Social History  Substance Use Topics  . Smoking status: Never Smoker   . Smokeless tobacco: None  . Alcohol Use: No    Allergies: No Known Allergies  Prescriptions prior to admission  Medication Sig Dispense Refill Last Dose  . calcium carbonate (TUMS - DOSED IN MG ELEMENTAL CALCIUM) 500 MG chewable tablet Chew 1 tablet by mouth daily as needed for indigestion or heartburn.   Past Week at Unknown time  . IRON PO Take 1 tablet by mouth daily.   11/27/2014 at Unknown time  . Prenatal Vit-Fe Fumarate-FA (PRENATAL MULTIVITAMIN) TABS tablet Take 1 tablet by mouth at  bedtime.   11/26/2014 at Unknown time    Review of Systems  Gastrointestinal: Negative.   Genitourinary: Negative.        No vaginal bleeding or LOF   Physical Exam   Blood pressure 136/79, pulse 107, temperature 98.7 F (37.1 C), temperature source Oral, resp. rate 16, height  (1.626 m), weight 229 lb (103.874 kg), unknown if currently breastfeeding.  Physical Exam  Nursing note and vitals reviewed. Constitutional: She is oriented to person, place, and time. She appears well-developed and well-nourished. No distress.  HENT:  Head: Normocephalic and atraumatic.  Eyes: Conjunctivae are normal. Right eye exhibits no discharge. Left eye exhibits no discharge. No scleral icterus.  Neck: Normal range of motion.  Cardiovascular: Normal rate.   Respiratory: Effort normal. No respiratory distress.  GI: Soft. There is no tenderness.  Neurological: She is alert and oriented to person, place, and time.  Skin: Skin is warm and dry. She is not diaphoretic.  Psychiatric: She has a normal mood and affect. Her behavior is normal. Judgment  and thought content normal.   Fetal Tracing:  Baseline: 140 Variability: moderate Accelerations: 15x15 Decelerations: none  Toco: irregular   MAU Course  Procedures  MDM Category 1 tracing Pt reports increase in fetal movement since being in MAU 1851-D/w Dr. Tenny Craw. Ok to discharge home  Assessment and Plan  A: 1. Decreased fetal movement in pregnancy, third trimester, not applicable or unspecified fetus    P: Discharge home Fetal kick counts Discussed reasons to return to MAU Keep scheduled appt in office  Judeth Horn, NP  05/13/2015, 6:39 PM

## 2015-05-13 NOTE — MAU Note (Signed)
Patient presents with decreased fetal movement and some tightening.

## 2015-05-24 ENCOUNTER — Inpatient Hospital Stay (HOSPITAL_COMMUNITY): Payer: BLUE CROSS/BLUE SHIELD | Admitting: Anesthesiology

## 2015-05-24 ENCOUNTER — Inpatient Hospital Stay (HOSPITAL_COMMUNITY)
Admission: AD | Admit: 2015-05-24 | Discharge: 2015-05-26 | DRG: 775 | Disposition: A | Discharge status: Home / Self Care | Payer: BLUE CROSS/BLUE SHIELD | Source: Ambulatory Visit | Attending: Obstetrics and Gynecology | Admitting: Obstetrics and Gynecology

## 2015-05-24 ENCOUNTER — Encounter (HOSPITAL_COMMUNITY): Payer: Self-pay | Admitting: *Deleted

## 2015-05-24 DIAGNOSIS — O99214 Obesity complicating childbirth: Secondary | ICD-10-CM | POA: Diagnosis present

## 2015-05-24 DIAGNOSIS — Z6841 Body Mass Index (BMI) 40.0 and over, adult: Secondary | ICD-10-CM | POA: Diagnosis not present

## 2015-05-24 DIAGNOSIS — Z3A38 38 weeks gestation of pregnancy: Secondary | ICD-10-CM | POA: Diagnosis not present

## 2015-05-24 LAB — CBC
HCT: 36.9 % (ref 36.0–46.0)
HEMOGLOBIN: 12.3 g/dL (ref 12.0–15.0)
MCH: 23.5 pg — AB (ref 26.0–34.0)
MCHC: 33.3 g/dL (ref 30.0–36.0)
MCV: 70.6 fL — ABNORMAL LOW (ref 78.0–100.0)
Platelets: 193 10*3/uL (ref 150–400)
RBC: 5.23 MIL/uL — ABNORMAL HIGH (ref 3.87–5.11)
RDW: 16.4 % — AB (ref 11.5–15.5)
WBC: 14.3 10*3/uL — ABNORMAL HIGH (ref 4.0–10.5)

## 2015-05-24 LAB — TYPE AND SCREEN
ABO/RH(D): O POS
ANTIBODY SCREEN: NEGATIVE

## 2015-05-24 MED ORDER — WITCH HAZEL-GLYCERIN EX PADS: 1.0000 "application " | MEDICATED_PAD | CUTANEOUS | Status: DC | PRN

## 2015-05-24 MED ORDER — OXYTOCIN 40 UNITS IN LACTATED RINGERS INFUSION - SIMPLE MED
1.0000 m[IU]/min | INTRAVENOUS | Status: DC
  Administered 2015-05-24: 2 m[IU]/min via INTRAVENOUS
  Filled 2015-05-24: qty 1000

## 2015-05-24 MED ORDER — LACTATED RINGERS IV SOLN
500.0000 mL | INTRAVENOUS | Status: DC | PRN
  Administered 2015-05-24 (×2): 500 mL via INTRAVENOUS

## 2015-05-24 MED ORDER — OXYTOCIN BOLUS FROM INFUSION
500.0000 mL | INTRAVENOUS | Status: DC
  Administered 2015-05-24: 500 mL via INTRAVENOUS

## 2015-05-24 MED ORDER — LANOLIN HYDROUS EX OINT: TOPICAL_OINTMENT | CUTANEOUS | Status: DC | PRN

## 2015-05-24 MED ORDER — BENZOCAINE-MENTHOL 20-0.5 % EX AERO
1.0000 "application " | INHALATION_SPRAY | CUTANEOUS | Status: DC | PRN
  Administered 2015-05-25: 1 via TOPICAL
  Filled 2015-05-24: qty 56

## 2015-05-24 MED ORDER — OXYTOCIN 40 UNITS IN LACTATED RINGERS INFUSION - SIMPLE MED: 62.5000 mL/h | INTRAVENOUS | Status: DC

## 2015-05-24 MED ORDER — LIDOCAINE HCL (PF) 1 % IJ SOLN
INTRAMUSCULAR | Status: DC | PRN
  Administered 2015-05-24 (×2): 5 mL

## 2015-05-24 MED ORDER — CITRIC ACID-SODIUM CITRATE 334-500 MG/5ML PO SOLN: 30.0000 mL | ORAL | Status: DC | PRN

## 2015-05-24 MED ORDER — MAGNESIUM HYDROXIDE 400 MG/5ML PO SUSP: 30.0000 mL | ORAL | Status: DC | PRN

## 2015-05-24 MED ORDER — DIPHENHYDRAMINE HCL 25 MG PO CAPS: 25.0000 mg | ORAL_CAPSULE | Freq: Four times a day (QID) | ORAL | Status: DC | PRN

## 2015-05-24 MED ORDER — OXYCODONE-ACETAMINOPHEN 5-325 MG PO TABS: 1.0000 | ORAL_TABLET | ORAL | Status: DC | PRN

## 2015-05-24 MED ORDER — DIPHENHYDRAMINE HCL 50 MG/ML IJ SOLN: 12.5000 mg | INTRAMUSCULAR | Status: DC | PRN

## 2015-05-24 MED ORDER — ZOLPIDEM TARTRATE 5 MG PO TABS
5.0000 mg | ORAL_TABLET | Freq: Every evening | ORAL | Status: DC | PRN
Start: 2015-05-24 — End: 2015-05-26

## 2015-05-24 MED ORDER — MEASLES, MUMPS & RUBELLA VAC ~~LOC~~ INJ
0.5000 mL | INJECTION | Freq: Once | SUBCUTANEOUS | Status: DC
  Filled 2015-05-24: qty 0.5

## 2015-05-24 MED ORDER — FENTANYL 2.5 MCG/ML BUPIVACAINE 1/10 % EPIDURAL INFUSION (WH - ANES): 14.0000 mL/h | INTRAMUSCULAR | Status: DC | PRN

## 2015-05-24 MED ORDER — ONDANSETRON HCL 4 MG/2ML IJ SOLN: 4.0000 mg | Freq: Four times a day (QID) | INTRAMUSCULAR | Status: DC | PRN

## 2015-05-24 MED ORDER — EPHEDRINE 5 MG/ML INJ: 10.0000 mg | INTRAVENOUS | Status: DC | PRN

## 2015-05-24 MED ORDER — ACETAMINOPHEN 325 MG PO TABS: 650.0000 mg | ORAL_TABLET | ORAL | Status: DC | PRN

## 2015-05-24 MED ORDER — ONDANSETRON HCL 4 MG/2ML IJ SOLN: 4.0000 mg | INTRAMUSCULAR | Status: DC | PRN

## 2015-05-24 MED ORDER — DIBUCAINE 1 % RE OINT: 1.0000 "application " | TOPICAL_OINTMENT | RECTAL | Status: DC | PRN

## 2015-05-24 MED ORDER — SODIUM CHLORIDE 0.9 % IJ SOLN: 3.0000 mL | INTRAMUSCULAR | Status: DC | PRN

## 2015-05-24 MED ORDER — TETANUS-DIPHTH-ACELL PERTUSSIS 5-2.5-18.5 LF-MCG/0.5 IM SUSP: 0.5000 mL | Freq: Once | INTRAMUSCULAR | Status: DC

## 2015-05-24 MED ORDER — TERBUTALINE SULFATE 1 MG/ML IJ SOLN: 0.2500 mg | Freq: Once | INTRAMUSCULAR | Status: DC | PRN

## 2015-05-24 MED ORDER — OXYCODONE-ACETAMINOPHEN 5-325 MG PO TABS: 2.0000 | ORAL_TABLET | ORAL | Status: DC | PRN

## 2015-05-24 MED ORDER — METHYLERGONOVINE MALEATE 0.2 MG PO TABS: 0.2000 mg | ORAL_TABLET | ORAL | Status: DC | PRN

## 2015-05-24 MED ORDER — OXYCODONE-ACETAMINOPHEN 5-325 MG PO TABS
1.0000 | ORAL_TABLET | ORAL | Status: DC | PRN
  Administered 2015-05-26: 1 via ORAL
  Filled 2015-05-24: qty 1

## 2015-05-24 MED ORDER — ONDANSETRON HCL 4 MG PO TABS: 4.0000 mg | ORAL_TABLET | ORAL | Status: DC | PRN

## 2015-05-24 MED ORDER — FENTANYL 2.5 MCG/ML BUPIVACAINE 1/10 % EPIDURAL INFUSION (WH - ANES)
14.0000 mL/h | INTRAMUSCULAR | Status: DC | PRN
  Administered 2015-05-24 (×2): 14 mL/h via EPIDURAL
  Filled 2015-05-24: qty 125

## 2015-05-24 MED ORDER — SENNOSIDES-DOCUSATE SODIUM 8.6-50 MG PO TABS
2.0000 | ORAL_TABLET | ORAL | Status: DC
  Administered 2015-05-25 (×2): 2 via ORAL
  Filled 2015-05-24 (×2): qty 2

## 2015-05-24 MED ORDER — PRENATAL MULTIVITAMIN CH
1.0000 | ORAL_TABLET | Freq: Every day | ORAL | Status: DC
  Administered 2015-05-25: 1 via ORAL
  Filled 2015-05-24: qty 1

## 2015-05-24 MED ORDER — SIMETHICONE 80 MG PO CHEW: 80.0000 mg | CHEWABLE_TABLET | ORAL | Status: DC | PRN

## 2015-05-24 MED ORDER — BUTORPHANOL TARTRATE 1 MG/ML IJ SOLN
1.0000 mg | Freq: Once | INTRAMUSCULAR | Status: AC
  Administered 2015-05-24: 1 mg via INTRAVENOUS
  Filled 2015-05-24: qty 1

## 2015-05-24 MED ORDER — LACTATED RINGERS IV SOLN
INTRAVENOUS | Status: DC
  Administered 2015-05-24: 12:00:00 via INTRAVENOUS

## 2015-05-24 MED ORDER — FERROUS SULFATE 325 (65 FE) MG PO TABS
325.0000 mg | ORAL_TABLET | Freq: Two times a day (BID) | ORAL | Status: DC
  Administered 2015-05-25 – 2015-05-26 (×3): 325 mg via ORAL
  Filled 2015-05-24 (×3): qty 1

## 2015-05-24 MED ORDER — PHENYLEPHRINE 40 MCG/ML (10ML) SYRINGE FOR IV PUSH (FOR BLOOD PRESSURE SUPPORT)
80.0000 ug | PREFILLED_SYRINGE | INTRAVENOUS | Status: DC | PRN
  Administered 2015-05-24: 80 ug via INTRAVENOUS
  Filled 2015-05-24: qty 20

## 2015-05-24 MED ORDER — METHYLERGONOVINE MALEATE 0.2 MG/ML IJ SOLN: 0.2000 mg | INTRAMUSCULAR | Status: DC | PRN

## 2015-05-24 MED ORDER — LIDOCAINE HCL (PF) 1 % IJ SOLN: 30.0000 mL | INTRAMUSCULAR | Status: DC | PRN

## 2015-05-24 MED ORDER — SODIUM CHLORIDE 0.9 % IV SOLN: 250.0000 mL | INTRAVENOUS | Status: DC | PRN

## 2015-05-24 MED ORDER — SODIUM CHLORIDE 0.9 % IJ SOLN: 3.0000 mL | Freq: Two times a day (BID) | INTRAMUSCULAR | Status: DC

## 2015-05-24 MED ORDER — IBUPROFEN 800 MG PO TABS
800.0000 mg | ORAL_TABLET | Freq: Three times a day (TID) | ORAL | Status: DC
  Administered 2015-05-25 – 2015-05-26 (×5): 800 mg via ORAL
  Filled 2015-05-24 (×5): qty 1

## 2015-05-24 NOTE — Progress Notes (Signed)
Dr Henderson CloudHorvath notified of pt's VE, Rupture of membranes, and FHR pattern, orders received

## 2015-05-24 NOTE — H&P (Signed)
29 y.o. 5083w3d  [id number] comes in c/o ROM clear at 10:30.  Otherwise has good fetal movement and no bleeding.  Past Medical History  Diagnosis Date  . UTI (lower urinary tract infection)     Past Surgical History  Procedure Laterality Date  . Wisdom tooth extraction      OB History  Gravida Para Term Preterm AB SAB TAB Ectopic Multiple Living  4 1 1  2 2    1     # Outcome Date GA Lbr Len/2nd Weight Sex Delivery Anes PTL Lv  4 Current           3 SAB              Comments: System Generated. Please review and update pregnancy details.  2 Term     M Vag-Spont   Y  1 SAB               Social History   Social History  . Marital Status: Married    Spouse Name: N/A  . Number of Children: N/A  . Years of Education: N/A   Occupational History  . Not on file.   Social History Main Topics  . Smoking status: Never Smoker   . Smokeless tobacco: Not on file  . Alcohol Use: No  . Drug Use: No  . Sexual Activity: Yes   Other Topics Concern  . Not on file   Social History Narrative   Review of patient's allergies indicates no known allergies.    Prenatal Transfer Tool  Maternal Diabetes: No Genetic Screening: Normal Maternal Ultrasounds/Referrals: Normal Fetal Ultrasounds or other Referrals:  None Maternal Substance Abuse:  No Significant Maternal Medications:  None Significant Maternal Lab Results: None  Other PNC: uncomplicated.    Filed Vitals:   05/24/15 1700  BP: 119/76  Pulse: 108  Temp:   Resp:      Lungs/Cor:  NAD Abdomen:  soft, gravid Ex:  no cords, erythema SVE:  5-6/50/-2 VTX, grossly ROM clear FHTs:  140s, good STV, NST R Toco:  q occ   A/P   Term PROM- start pit.  GBS neg  Bijal Siglin A

## 2015-05-24 NOTE — Anesthesia Preprocedure Evaluation (Signed)
Anesthesia Evaluation  Patient identified by MRN, date of birth, ID band Patient awake and Patient confused    Reviewed: Allergy & Precautions, H&P , NPO status , Patient's Chart, lab work & pertinent test results  Airway Mallampati: II       Dental   Pulmonary    Pulmonary exam normal breath sounds clear to auscultation       Cardiovascular Exercise Tolerance: Good Normal cardiovascular exam Rhythm:regular Rate:Normal     Neuro/Psych    GI/Hepatic   Endo/Other  Morbid obesity  Renal/GU      Musculoskeletal   Abdominal   Peds  Hematology   Anesthesia Other Findings   Reproductive/Obstetrics (+) Pregnancy                             Anesthesia Physical Anesthesia Plan  ASA: III  Anesthesia Plan: Epidural   Post-op Pain Management:    Induction:   Airway Management Planned:   Additional Equipment:   Intra-op Plan:   Post-operative Plan:   Informed Consent: I have reviewed the patients History and Physical, chart, labs and discussed the procedure including the risks, benefits and alternatives for the proposed anesthesia with the patient or authorized representative who has indicated his/her understanding and acceptance.     Plan Discussed with:   Anesthesia Plan Comments:         Anesthesia Quick Evaluation  

## 2015-05-24 NOTE — Anesthesia Procedure Notes (Signed)
Epidural Patient location during procedure: OB Start time: 05/24/2015 6:50 PM End time: 05/24/2015 7:05 PM  Staffing Anesthesiologist: Sebastian AcheMANNY, Demetrius Barrell  Preanesthetic Checklist Completed: patient identified, site marked, surgical consent, pre-op evaluation, timeout performed, IV checked, risks and benefits discussed and monitors and equipment checked  Epidural Patient position: sitting Prep: site prepped and draped and DuraPrep Patient monitoring: heart rate, continuous pulse ox and blood pressure Approach: midline Location: L3-L4 Injection technique: LOR air  Needle:  Needle type: Tuohy  Needle gauge: 17 G Needle length: 9 cm and 9 Needle insertion depth: 6 cm Catheter type: closed end flexible Catheter size: 20 Guage Catheter at skin depth: 14 cm Test dose: negative and 1.5% lidocaine with Epi 1:200 K  Assessment Events: blood not aspirated, injection not painful, no injection resistance, negative IV test and no paresthesia  Additional Notes   Patient tolerated the insertion well without complications.Reason for block:procedure for pain

## 2015-05-24 NOTE — MAU Note (Signed)
Pt presents to MAU with complaints of rupture of membranes at 1030 this morning. Denies any vaginal bleeding

## 2015-05-25 LAB — CBC
HEMATOCRIT: 31.1 % — AB (ref 36.0–46.0)
HEMOGLOBIN: 10.2 g/dL — AB (ref 12.0–15.0)
MCH: 23.2 pg — ABNORMAL LOW (ref 26.0–34.0)
MCHC: 32.8 g/dL (ref 30.0–36.0)
MCV: 70.7 fL — AB (ref 78.0–100.0)
Platelets: 197 10*3/uL (ref 150–400)
RBC: 4.4 MIL/uL (ref 3.87–5.11)
RDW: 16.3 % — AB (ref 11.5–15.5)
WBC: 20.8 10*3/uL — AB (ref 4.0–10.5)

## 2015-05-25 LAB — RPR: RPR Ser Ql: NONREACTIVE

## 2015-05-25 NOTE — Progress Notes (Signed)
Patient is doing well.  She is ambulating, voiding, tolerating PO.  Pain control is good.  Lochia is appropriate  Filed Vitals:   05/24/15 2246 05/24/15 2319 05/25/15 0020 05/25/15 0430  BP: 137/82 140/75 130/70 116/62  Pulse: 108 113 112 94  Temp:  99.5 F (37.5 C) 98.4 F (36.9 C) 98.1 F (36.7 C)  TempSrc:  Oral Oral Oral  Resp:  20 20 20   Height:      Weight:      SpO2:  100% 100% 100%    NAD Fundus firm Ext: 1+ edema b/l, symmetric  Lab Results  Component Value Date   WBC 20.8* 05/25/2015   HGB 10.2* 05/25/2015   HCT 31.1* 05/25/2015   MCV 70.7* 05/25/2015   PLT 197 05/25/2015    --/--/O POS (10/13 1150)/RImmune  A/P 28 y.o. [id number] PPD#1 s/p TSVD. Routine care.   Expect d/c tomorrow  [place] [facility] GEFFEL Chestine SporeLARK

## 2015-05-25 NOTE — Lactation Note (Signed)
This note was copied from the chart of Emily Mason. Lactation Consultation Note  Patient Name: Emily Mason Today's Date: 05/25/2015 Reason for consult: Initial assessment Baby was at the breast numerous times during the night but sleepy today. Demonstrated awakening techniques, assisted Mom with postioning and obtaining good depth with latch. Basic teaching reviewed with Mom. Cluster feeding discussed. Mom is experienced BF. Lactation brochure left for review, advised of OP services and support group. Encouraged to call for assist as needed.    Maternal Data Formula Feeding for Exclusion: No Has patient been taught Hand Expression?: Yes Does the patient have breastfeeding experience prior to this delivery?: Yes  Feeding Feeding Type: Breast Fed Length of feed: 0 min  LATCH Score/Interventions Latch: Grasps breast easily, tongue down, lips flanged, rhythmical sucking. Intervention(s): Adjust position;Assist with latch;Breast massage;Breast compression  Audible Swallowing: A few with stimulation  Type of Nipple: Everted at rest and after stimulation  Comfort (Breast/Nipple): Soft / non-tender     Hold (Positioning): Assistance needed to correctly position infant at breast and maintain latch. Intervention(s): Breastfeeding basics reviewed;Support Pillows;Position options;Skin to skin  LATCH Score: 8  Lactation Tools Discussed/Used WIC Program: No   Consult Status Consult Status: Follow-up Date: 05/26/15 Follow-up type: In-patient    Emily Mason, Emily Mason 05/25/2015, 2:31 PM

## 2015-05-25 NOTE — Anesthesia Postprocedure Evaluation (Signed)
  Anesthesia Post-op Note  Patient: Emily Mason  Procedure(s) Performed: * No procedures listed *  Patient Location: Mother/Baby  Anesthesia Type:Epidural  Level of Consciousness: awake  Airway and Oxygen Therapy: Patient Spontanous Breathing  Post-op Pain: mild  Post-op Assessment: Patient's Cardiovascular Status Stable and Respiratory Function Stable              Post-op Vital Signs: stable  Last Vitals:  Filed Vitals:   05/25/15 0430  BP: 116/62  Pulse: 94  Temp: 36.7 C  Resp: 20    Complications: No apparent anesthesia complications

## 2015-05-26 MED ORDER — IBUPROFEN 800 MG PO TABS: 800.0000 mg | ORAL_TABLET | Freq: Four times a day (QID) | ORAL | Status: DC | PRN

## 2015-05-26 NOTE — Progress Notes (Signed)
Patient is doing well.  She is ambulating, voiding, tolerating PO.  Pain control is good.  Lochia is appropriate  Filed Vitals:   05/25/15 0020 05/25/15 0430 05/25/15 1755 05/26/15 0524  BP: 130/70 116/62 114/58 120/81  Pulse: 112 94 99 88  Temp: 98.4 F (36.9 C) 98.1 F (36.7 C) 98.3 F (36.8 C) 98.4 F (36.9 C)  TempSrc: Oral Oral Oral Oral  Resp: 20 20 18 18   Height:      Weight:      SpO2: 100% 100%      NAD Fundus firm Ext: 1+ edema b/l, symmetric  Lab Results  Component Value Date   WBC 20.8* 05/25/2015   HGB 10.2* 05/25/2015   HCT 31.1* 05/25/2015   MCV 70.7* 05/25/2015   PLT 197 05/25/2015    --/--/O POS (10/13 1150)/RImmune  A/P 28 y.o. [id number] PPD#2 s/p TSVD. Routine care.   Meeting all goals--d/c today.  Declines percocet rx   Kaladin Noseworthy GEFFEL Nishita Isaacks

## 2015-05-26 NOTE — Discharge Summary (Signed)
Obstetric Discharge Summary Reason for Admission: onset of labor Prenatal Procedures: none Intrapartum Procedures: spontaneous vaginal delivery Postpartum Procedures: none Complications-Operative and Postpartum: 2 degree perineal laceration HEMOGLOBIN  Date Value Ref Range Status  05/25/2015 10.2* 12.0 - 15.0 g/dL Final   HCT  Date Value Ref Range Status  05/25/2015 31.1* 36.0 - 46.0 % Final    Physical Exam:  General: alert, cooperative and appears stated age 13Lochia: appropriate Uterine Fundus: firm DVT Evaluation: No evidence of DVT seen on physical exam.  Discharge Diagnoses: Term Pregnancy-delivered  Discharge Information: Date: 05/26/2015 Activity: pelvic rest Diet: routine Medications: PNV and Ibuprofen Condition: stable Instructions: refer to practice specific booklet Discharge to: home Follow-up Information    Follow up with HORVATH,MICHELLE A, MD In 4 weeks.   Specialty:  Obstetrics and Gynecology   Contact information:   [address] [place] [address] [phone]       Newborn Data: Live born female  Birth Weight: 8 lb 10.5 oz (3925 g) APGAR: 8, 9  Home with mother.  Emily Mason GEFFEL Wendel Homeyer 05/26/2015, 9:21 AM

## 2015-06-06 IMAGING — US US OB COMP LESS 14 WK
1 series · 14 of 28 positions shown · non-contrast
Comparison: Ultrasound 04/25/2013

CLINICAL DATA: Pregnant patient with vaginal bleeding.

EXAM:
OBSTETRIC <14 WK ULTRASOUND
TECHNIQUE: Transabdominal ultrasound was performed for evaluation of the
gestation as well as the maternal uterus and adnexal regions.

[Series 1: us ob follow up · 14 of 29 slices shown]
[im 2/29]
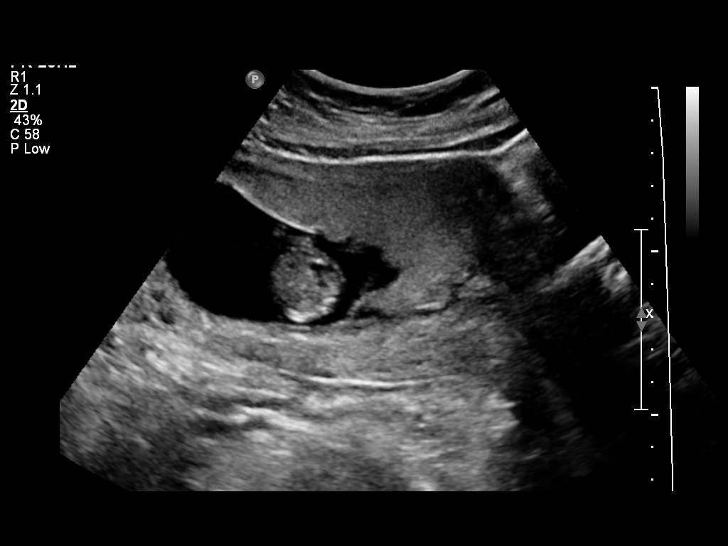
[im 4/29]
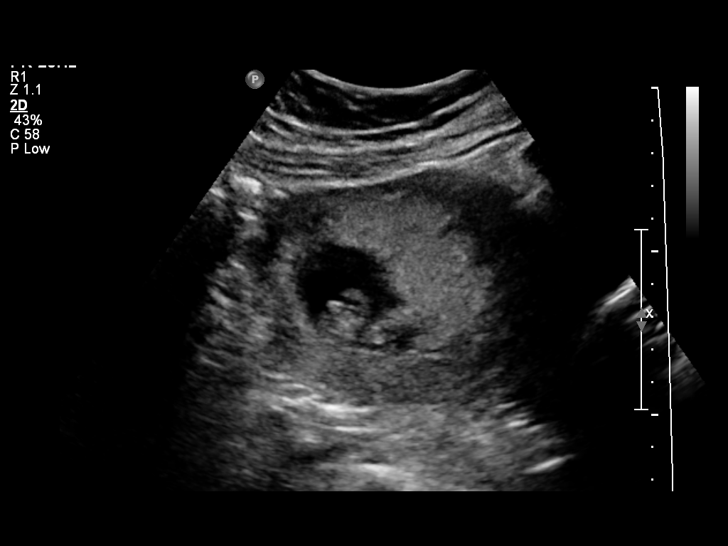
[im 6/29]
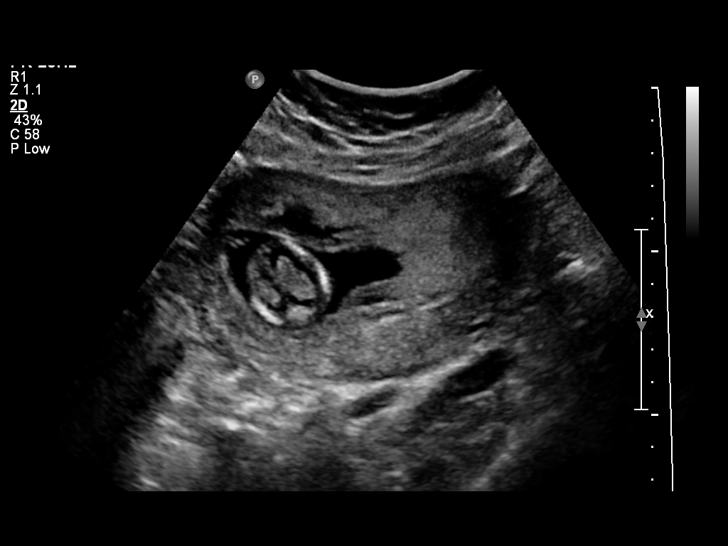
[im 8/29]
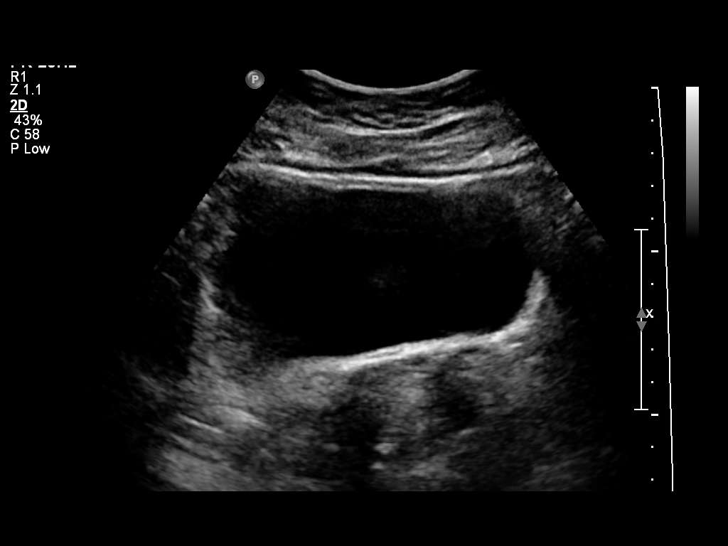
[im 10/29]
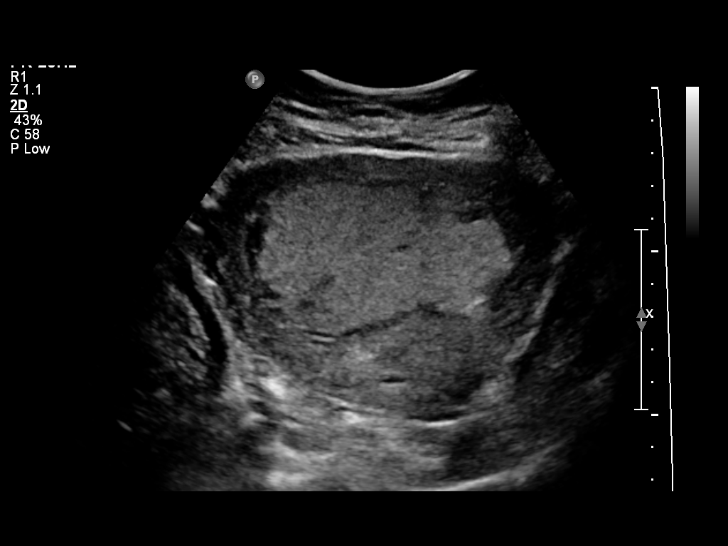
[im 12/29]
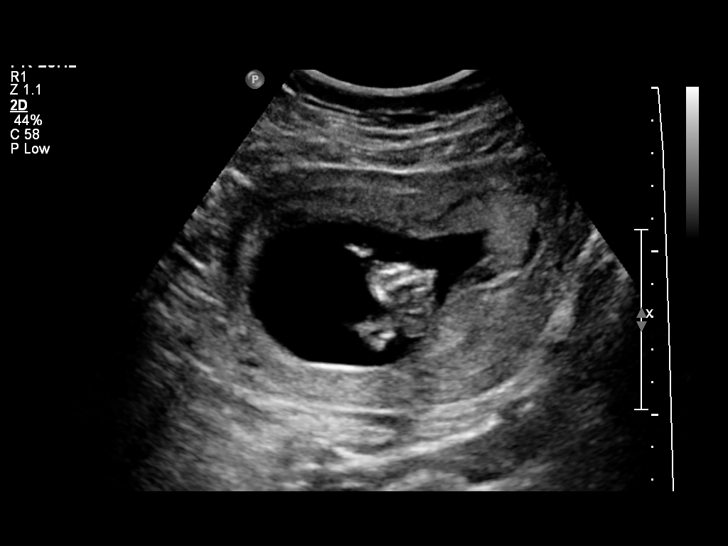
[im 14/29]
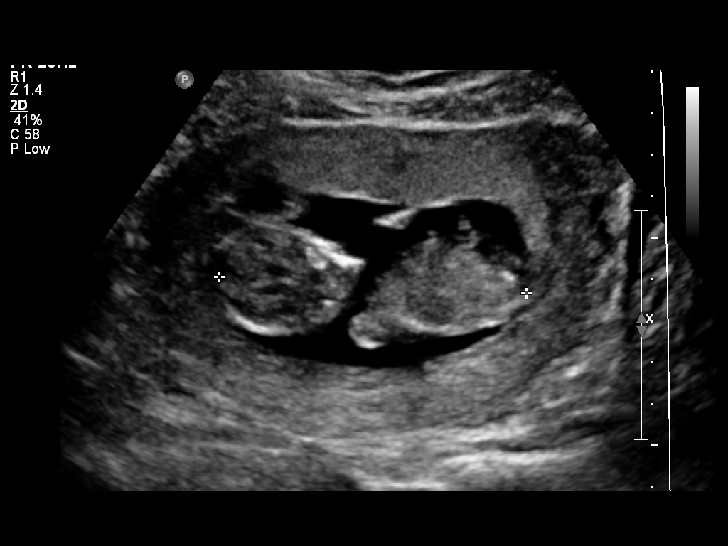
[im 16/29]
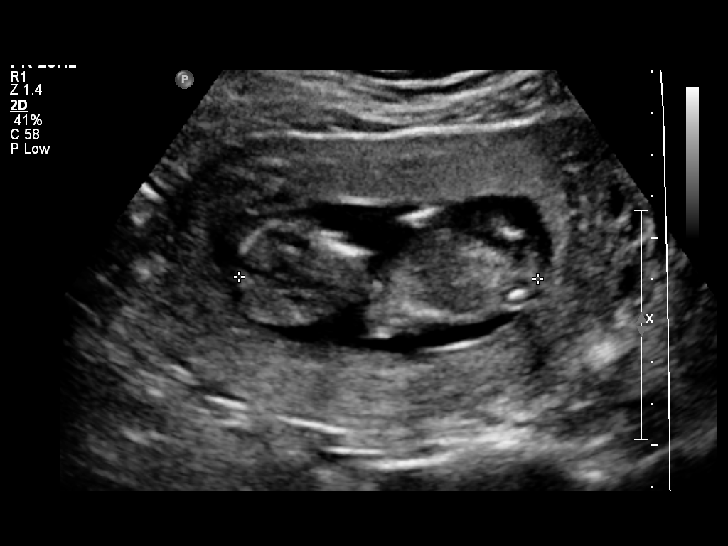
[im 18/29]
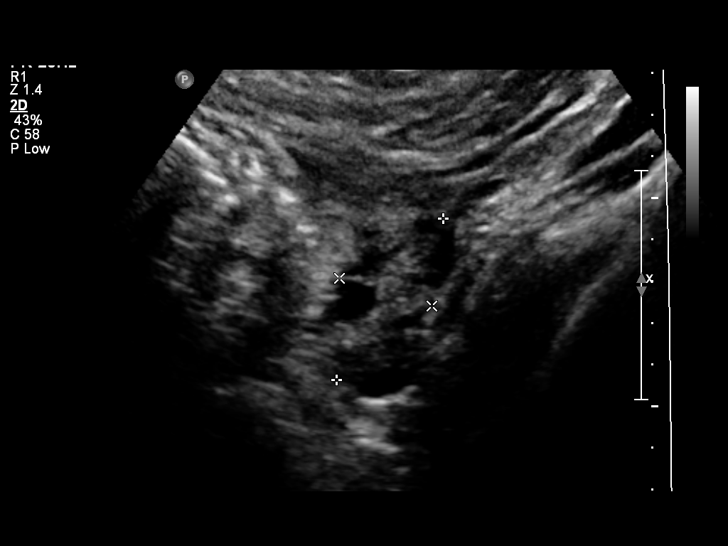
[im 20/29]
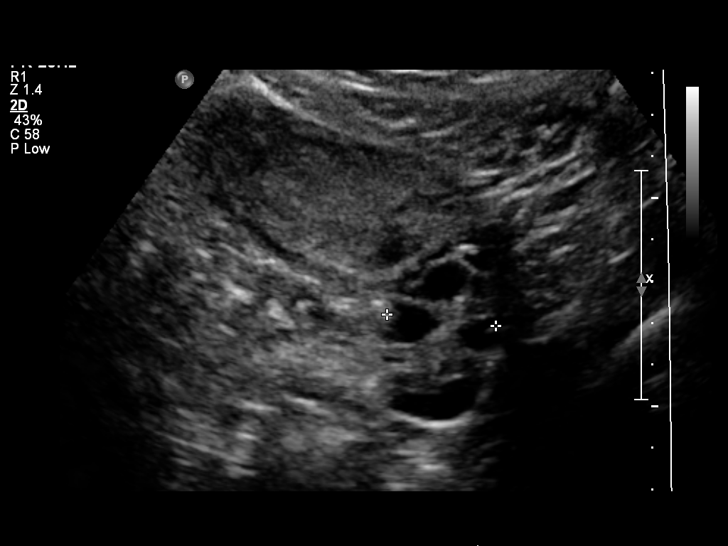
[im 22/29]
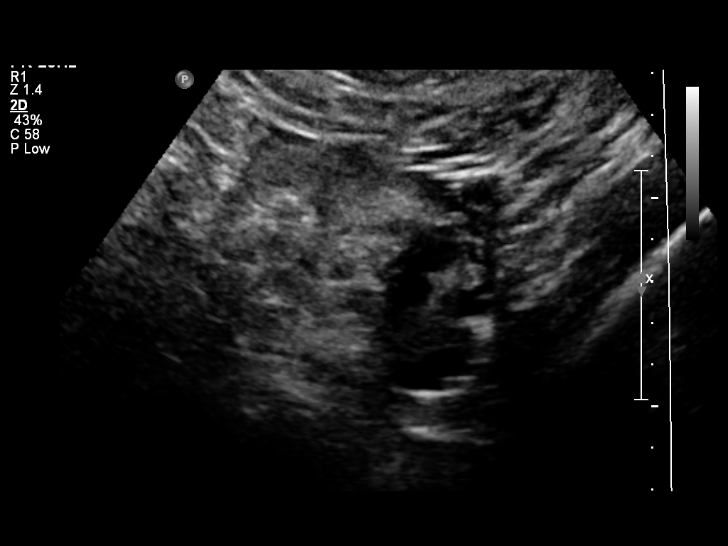
[im 24/29]
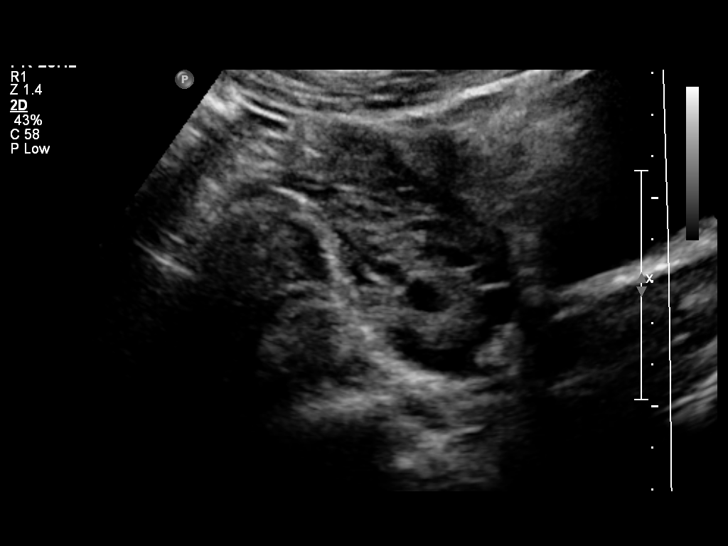
[im 26/29]
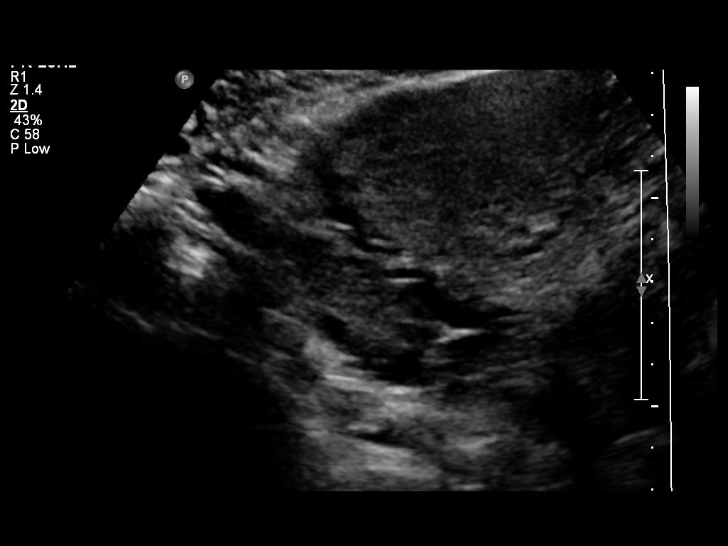
[im 29/29]
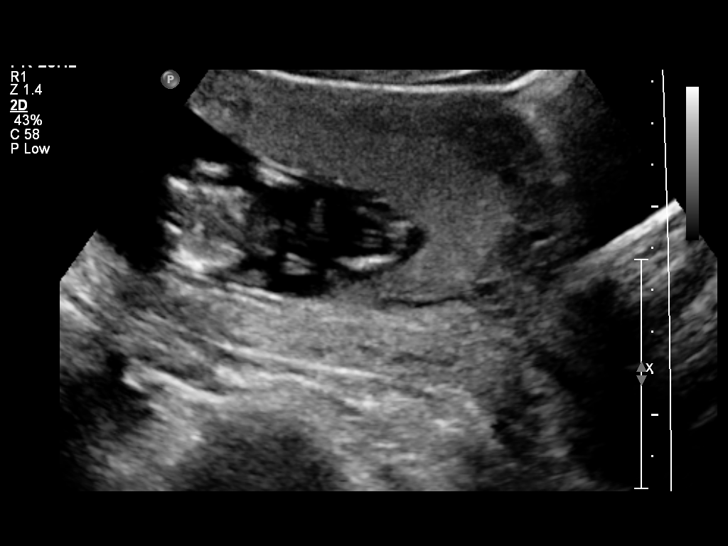

[14 of 28 positions shown; findings below may reference images not displayed]

FINDINGS: Intrauterine gestational sac: Visualized/normal in shape.

Yolk sac:  Present

Embryo:  Present

Cardiac Activity: Present

Heart Rate: 165 bpm

CRL:   71.9  mm   13 w 3 d                  US EDC: 06/01/2015

Maternal uterus/adnexae: No subchorionic hemorrhage. Normal right
and left ovary. No free fluid in the pelvis.
IMPRESSION: Single live intrauterine gestation 13 weeks 0 days by LMP.

## 2016-02-19 ENCOUNTER — Encounter (HOSPITAL_COMMUNITY): Payer: Self-pay | Admitting: *Deleted

## 2016-02-19 ENCOUNTER — Inpatient Hospital Stay (HOSPITAL_COMMUNITY)
Admission: AD | Admit: 2016-02-19 | Discharge: 2016-02-22 | DRG: 419 | Disposition: A | Discharge status: Home / Self Care | Payer: BLUE CROSS/BLUE SHIELD | Source: Ambulatory Visit | Attending: General Surgery | Admitting: General Surgery

## 2016-02-19 ENCOUNTER — Inpatient Hospital Stay (HOSPITAL_COMMUNITY): Payer: Self-pay

## 2016-02-19 DIAGNOSIS — Z6831 Body mass index (BMI) 31.0-31.9, adult: Secondary | ICD-10-CM

## 2016-02-19 DIAGNOSIS — E669 Obesity, unspecified: Secondary | ICD-10-CM | POA: Diagnosis present

## 2016-02-19 DIAGNOSIS — K810 Acute cholecystitis: Secondary | ICD-10-CM

## 2016-02-19 DIAGNOSIS — K8000 Calculus of gallbladder with acute cholecystitis without obstruction: Principal | ICD-10-CM | POA: Diagnosis present

## 2016-02-19 DIAGNOSIS — R945 Abnormal results of liver function studies: Secondary | ICD-10-CM

## 2016-02-19 DIAGNOSIS — R7989 Other specified abnormal findings of blood chemistry: Secondary | ICD-10-CM

## 2016-02-19 DIAGNOSIS — K819 Cholecystitis, unspecified: Secondary | ICD-10-CM

## 2016-02-19 DIAGNOSIS — Z419 Encounter for procedure for purposes other than remedying health state, unspecified: Secondary | ICD-10-CM

## 2016-02-19 DIAGNOSIS — R1011 Right upper quadrant pain: Secondary | ICD-10-CM

## 2016-02-19 LAB — COMPREHENSIVE METABOLIC PANEL
ALT: 286 U/L — AB (ref 14–54)
AST: 352 U/L — AB (ref 15–41)
Albumin: 4.4 g/dL (ref 3.5–5.0)
Alkaline Phosphatase: 113 U/L (ref 38–126)
Anion gap: 6 (ref 5–15)
BUN: 17 mg/dL (ref 6–20)
CHLORIDE: 104 mmol/L (ref 101–111)
CO2: 26 mmol/L (ref 22–32)
CREATININE: 0.56 mg/dL (ref 0.44–1.00)
Calcium: 9.1 mg/dL (ref 8.9–10.3)
Glucose, Bld: 107 mg/dL — ABNORMAL HIGH (ref 65–99)
POTASSIUM: 4.6 mmol/L (ref 3.5–5.1)
SODIUM: 136 mmol/L (ref 135–145)
Total Bilirubin: 1.7 mg/dL — ABNORMAL HIGH (ref 0.3–1.2)
Total Protein: 7.8 g/dL (ref 6.5–8.1)

## 2016-02-19 LAB — CBC WITH DIFFERENTIAL/PLATELET
BASOS ABS: 0 10*3/uL (ref 0.0–0.1)
Basophils Relative: 0 %
EOS ABS: 0 10*3/uL (ref 0.0–0.7)
EOS PCT: 0 %
HCT: 37.4 % (ref 36.0–46.0)
Hemoglobin: 12.6 g/dL (ref 12.0–15.0)
Lymphocytes Relative: 24 %
Lymphs Abs: 1.5 10*3/uL (ref 0.7–4.0)
MCH: 22.9 pg — AB (ref 26.0–34.0)
MCHC: 33.7 g/dL (ref 30.0–36.0)
MCV: 67.9 fL — ABNORMAL LOW (ref 78.0–100.0)
MONO ABS: 0.4 10*3/uL (ref 0.1–1.0)
Monocytes Relative: 6 %
Neutro Abs: 4.2 10*3/uL (ref 1.7–7.7)
Neutrophils Relative %: 69 %
PLATELETS: 240 10*3/uL (ref 150–400)
RBC: 5.51 MIL/uL — AB (ref 3.87–5.11)
RDW: 15.8 % — AB (ref 11.5–15.5)
WBC: 6 10*3/uL (ref 4.0–10.5)

## 2016-02-19 LAB — URINALYSIS, ROUTINE W REFLEX MICROSCOPIC
BILIRUBIN URINE: NEGATIVE
GLUCOSE, UA: NEGATIVE mg/dL
HGB URINE DIPSTICK: NEGATIVE
Ketones, ur: NEGATIVE mg/dL
Leukocytes, UA: NEGATIVE
Nitrite: NEGATIVE
PROTEIN: NEGATIVE mg/dL
Specific Gravity, Urine: <1.005 — ABNORMAL LOW (ref 1.005–1.030)
pH: 7 (ref 5.0–8.0)

## 2016-02-19 LAB — POCT PREGNANCY, URINE: PREG TEST UR: NEGATIVE

## 2016-02-19 LAB — LIPASE, BLOOD: LIPASE: 27 U/L (ref 11–51)

## 2016-02-19 MED ORDER — LIP MEDEX EX OINT
TOPICAL_OINTMENT | CUTANEOUS | Status: AC
  Administered 2016-02-20
  Filled 2016-02-19: qty 7

## 2016-02-19 MED ORDER — KCL IN DEXTROSE-NACL 20-5-0.45 MEQ/L-%-% IV SOLN
INTRAVENOUS | Status: DC
  Administered 2016-02-19 – 2016-02-21 (×2): via INTRAVENOUS
  Filled 2016-02-19 (×5): qty 1000

## 2016-02-19 MED ORDER — MORPHINE SULFATE (PF) 2 MG/ML IV SOLN: 2.0000 mg | INTRAVENOUS | Status: DC | PRN

## 2016-02-19 MED ORDER — OXYCODONE HCL 5 MG PO TABS: 5.0000 mg | ORAL_TABLET | ORAL | Status: DC | PRN

## 2016-02-19 MED ORDER — CHLORHEXIDINE GLUCONATE 0.12 % MT SOLN
15.0000 mL | Freq: Two times a day (BID) | OROMUCOSAL | Status: DC
  Administered 2016-02-19 – 2016-02-21 (×3): 15 mL via OROMUCOSAL
  Filled 2016-02-19 (×6): qty 15

## 2016-02-19 MED ORDER — ONDANSETRON 4 MG PO TBDP
4.0000 mg | ORAL_TABLET | Freq: Four times a day (QID) | ORAL | Status: DC | PRN
Start: 2016-02-19 — End: 2016-02-21

## 2016-02-19 MED ORDER — ONDANSETRON HCL 4 MG/2ML IJ SOLN: 4.0000 mg | Freq: Four times a day (QID) | INTRAMUSCULAR | Status: DC | PRN

## 2016-02-19 MED ORDER — DEXTROSE 5 % IV SOLN
2.0000 g | INTRAVENOUS | Status: DC
  Administered 2016-02-19 – 2016-02-20 (×2): 2 g via INTRAVENOUS
  Filled 2016-02-19 (×4): qty 2

## 2016-02-19 MED ORDER — CETYLPYRIDINIUM CHLORIDE 0.05 % MT LIQD: 7.0000 mL | Freq: Two times a day (BID) | OROMUCOSAL | Status: DC

## 2016-02-19 NOTE — ED Provider Notes (Signed)
CSN: [id number]     Arrival date & time 02/19/16  1112 History   First MD Initiated Contact with Patient 02/19/16 1719     Chief Complaint  Patient presents with  . Abdominal Pain  . Tinea     (Consider location/radiation/quality/duration/timing/severity/associated sxs/prior Treatment) The history is provided by the patient and medical records.    30 year old female here for evaluation of elevated liver enzymes and gallstones noted on ultrasound. She was seen at [facility] hospital earlier this morning and sent here for further evaluation. She reports she's had intermittent epigastric and right upper quadrant pain since April. She states generally when this occurs it will last for 1-3 hours but will resolve spontaneously. She states this episode of pain has been longer in duration and more severe. She states pain is improved since onset, still feels some mild discomfort. She's not had any emesis since 3 AM this morning. She denies any fever or chills. No prior abdominal surgeries.  Past Medical History  Diagnosis Date  . UTI (lower urinary tract infection)    Past Surgical History  Procedure Laterality Date  . Wisdom tooth extraction     Family History  Problem Relation Age of Onset  . Alcohol abuse Neg Hx   . Arthritis Neg Hx   . Asthma Neg Hx   . Birth defects Neg Hx   . Cancer Neg Hx   . COPD Neg Hx   . Depression Neg Hx   . Diabetes Neg Hx   . Drug abuse Neg Hx   . Early death Neg Hx   . Hearing loss Neg Hx   . Heart disease Neg Hx   . Hyperlipidemia Neg Hx   . Hypertension Neg Hx   . Kidney disease Neg Hx   . Learning disabilities Neg Hx   . Mental illness Neg Hx   . Mental retardation Neg Hx   . Miscarriages / Stillbirths Neg Hx   . Vision loss Neg Hx   . Stroke Neg Hx   . Varicose Veins Neg Hx    Social History  Substance Use Topics  . Smoking status: Never Smoker   . Smokeless tobacco: None  . Alcohol Use: No   OB History    Gravida Para Term Preterm AB TAB  SAB Ectopic Multiple Living   4 2 2  2  2   0 2     Review of Systems  Gastrointestinal: Positive for nausea, vomiting and abdominal pain.  All other systems reviewed and are negative.     Allergies  Review of patient's allergies indicates no known allergies.  Home Medications   Prior to Admission medications   Medication Sig Start Date End Date Taking? Authorizing Provider  polyethylene glycol (MIRALAX / GLYCOLAX) packet Take 17 g by mouth once.    Yes Historical Provider, MD  Prenatal Vit-Fe Fumarate-FA (PRENATAL MULTIVITAMIN) TABS tablet Take 1 tablet by mouth daily.    Yes Historical Provider, MD  ibuprofen (ADVIL,MOTRIN) 800 MG tablet Take 1 tablet (800 mg total) by mouth every 6 (six) hours as needed for moderate pain or cramping. 05/26/15   Marlow Baars, MD   BP 131/95 mmHg  Pulse 90  Temp(Src) 98.8 F (37.1 C) (Oral)  Resp 18  Ht 5\' 5"  (1.651 m)  Wt 86.456 kg  BMI 31.72 kg/m2  SpO2 100%  LMP 02/06/2016  Breastfeeding? Yes   Physical Exam  Constitutional: She is oriented to person, place, and time. She appears well-developed and well-nourished. No distress.  HENT:  Head: Normocephalic and atraumatic.  Mouth/Throat: Oropharynx is clear and moist.  Eyes: Conjunctivae and EOM are normal. Pupils are equal, round, and reactive to light.  Neck: Normal range of motion. Neck supple.  Cardiovascular: Normal rate, regular rhythm and normal heart sounds.   Pulmonary/Chest: Effort normal and breath sounds normal. No respiratory distress. She has no wheezes.  Abdominal: Soft. Bowel sounds are normal. There is no tenderness. There is no guarding.  Abdomen soft, non-tender at this time  Musculoskeletal: Normal range of motion. She exhibits no edema.  Neurological: She is alert and oriented to person, place, and time.  Skin: Skin is warm and dry. She is not diaphoretic.  Psychiatric: She has a normal mood and affect.  Nursing note and vitals reviewed.   ED Course   Procedures (including critical care time) Labs Review Labs Reviewed  URINALYSIS, ROUTINE W REFLEX MICROSCOPIC (NOT AT [facility]) - Abnormal; Notable for the following:    Specific Gravity, Urine <1.005 (*)    All other components within normal limits  CBC WITH DIFFERENTIAL/PLATELET - Abnormal; Notable for the following:    RBC 5.51 (*)    MCV 67.9 (*)    MCH 22.9 (*)    RDW 15.8 (*)    All other components within normal limits  COMPREHENSIVE METABOLIC PANEL - Abnormal; Notable for the following:    Glucose, Bld 107 (*)    AST 352 (*)    ALT 286 (*)    Total Bilirubin 1.7 (*)    All other components within normal limits  LIPASE, BLOOD  POCT PREGNANCY, URINE    Imaging Review [place] Abdomen Limited  02/19/2016  CLINICAL DATA:  Right upper quadrant pain EXAM: US ABDOMEN LIMITED - RIGHT UPPER QUADRANT COMPARISON:  None. FINDINGS: Gallbladder: Gallstones are noted within gallbladder the largest measures 5 mm. There is trace pericholecystic fluid. The patient is tender when scanning gallbladder region. Early cholecystitis cannot be excluded. Clinical correlation is necessary. Common bile duct: Diameter: 3 mm in diameter within normal limits. Liver: No focal lesion identified. Within normal limits in parenchymal echogenicity. IMPRESSION: Gallstones are noted within gallbladder the largest measures 5 mm. There is trace pericholecystic fluid. The patient is tender when scanning gallbladder region. Early cholecystitis cannot be excluded. Clinical correlation is necessary. Normal CBD. Electronically Signed   By: Natasha MeadLiviu  Pop M.D.   On: 02/19/2016 14:04   I have personally reviewed and evaluated these images and lab results as part of my medical decision-making.   EKG Interpretation None      MDM   Final diagnoses:  Cholecystitis, acute   30 [facility]. She is found to have elevated LFTs and questionable early cholecystitis on  ultrasound. On exam she is afebrile and nontoxic. Her abdomen is soft and nontender at this time. Reports that she has pain it is localized to the epigastrium and right upper quadrant. Labs and ultrasound as above. General surgery consulted, they will admit for further management.  Garlon HatchetLisa M Sanders, PA-C 02/19/16 1852  Maia PlanJoshua G Long, MD 02/20/16 1001

## 2016-02-19 NOTE — Progress Notes (Signed)
PT d/c with her sister [facility]. Do not eat or drink anything.

## 2016-02-19 NOTE — MAU Note (Addendum)
Had baby last Oct. Starting in April would have upper abd cramping before my period started and then would go away. Starting about 0200 started having pain in upper abd and vomited x1. No diarrhea. Feels like contractions. Ate spare ribs and green beans at 2200 last night. Denies vag bleeding or d/c. Noticed red rash on R cheek this am. Googled and thinks may be ringworm but no know exposure

## 2016-02-19 NOTE — MAU Provider Note (Signed)
CSN: [id number]     Arrival date & time 02/19/16  1112 History   None    Chief Complaint  Patient presents with  . Abdominal Pain  . Tinea     (Consider location/radiation/quality/duration/timing/severity/associated sxs/prior Treatment) Abdominal Pain This is a new problem. The current episode started today. The onset quality is gradual. The problem occurs constantly. The most recent episode lasted 10 hours. The problem has been gradually worsening. The pain is located in the epigastric region. The pain is at a severity of 6/10. The quality of the pain is colicky. The abdominal pain does not radiate. Associated symptoms include flatus, nausea and vomiting (x1). Pertinent negatives include no anorexia, constipation, diarrhea, dysuria, fever, frequency, headaches or weight loss. The pain is aggravated by eating and deep breathing. The pain is relieved by nothing. She has tried nothing for the symptoms. There is no history of abdominal surgery, colon cancer, Crohn's disease, gallstones, GERD, irritable bowel syndrome, pancreatitis, PUD or ulcerative colitis.   Jeniffer DILAN FULLENWIDER is a 30 y.o. [phone] who presents to MAU with abdominal pain that started approximately 2 am. Patient reports eating fried tenderloin and green beans about 10 pm and then woke at 2 am. She reports nausea with one episode of vomiting this morning.   Past Medical History  Diagnosis Date  . UTI (lower urinary tract infection)    Past Surgical History  Procedure Laterality Date  . Wisdom tooth extraction     Family History  Problem Relation Age of Onset  . Alcohol abuse Neg Hx   . Arthritis Neg Hx   . Asthma Neg Hx   . Birth defects Neg Hx   . Cancer Neg Hx   . COPD Neg Hx   . Depression Neg Hx   . Diabetes Neg Hx   . Drug abuse Neg Hx   . Early death Neg Hx   . Hearing loss Neg Hx   . Heart disease Neg Hx   . Hyperlipidemia Neg Hx   . Hypertension Neg Hx   . Kidney disease Neg Hx   . Learning disabilities Neg  Hx   . Mental illness Neg Hx   . Mental retardation Neg Hx   . Miscarriages / Stillbirths Neg Hx   . Vision loss Neg Hx   . Stroke Neg Hx   . Varicose Veins Neg Hx    Social History  Substance Use Topics  . Smoking status: Never Smoker   . Smokeless tobacco: None  . Alcohol Use: No   OB History    Gravida Para Term Preterm AB TAB SAB Ectopic Multiple Living   0 2     Review of Systems  Constitutional: Negative for fever, chills and weight loss.  HENT: Negative.   Eyes: Negative for visual disturbance.  Respiratory: Negative for cough, chest tightness and shortness of breath.   Cardiovascular: Negative for chest pain and palpitations.  Gastrointestinal: Positive for nausea, vomiting (x1), abdominal pain and flatus. Negative for diarrhea, constipation and anorexia.  Genitourinary: Negative for dysuria, urgency, frequency, decreased urine volume, vaginal bleeding and vaginal discharge.  Musculoskeletal: Negative for back pain.  Skin: Negative for rash.  Allergic/Immunologic: Negative for food allergies.  Neurological: Negative for speech difficulty and headaches.  Psychiatric/Behavioral: Negative for confusion. The patient is not nervous/anxious.       Allergies  Review of patient's allergies indicates no known allergies.  Home Medications   Prior to Admission medications  Medication Sig Start Date End Date Taking? Authorizing Provider  polyethylene glycol (MIRALAX / GLYCOLAX) packet Take 17 g by mouth daily.   Yes Historical Provider, MD  Prenatal Vit-Fe Fumarate-FA (PRENATAL MULTIVITAMIN) TABS tablet Take 1 tablet by mouth daily.    Yes Historical Provider, MD  ibuprofen (ADVIL,MOTRIN) 800 MG tablet Take 1 tablet (800 mg total) by mouth every 6 (six) hours as needed for moderate pain or cramping. 05/26/15   Marlow Baars, MD   BP 122/89 mmHg  Pulse 79  Temp(Src) 98 F (36.7 C)  Resp 18  Ht 5\' 5"  (1.651 m)  Wt 190 lb 9.6 oz (86.456 kg)  BMI 31.72 kg/m2   LMP 02/06/2016  Breastfeeding? Yes Physical Exam  Constitutional: She is oriented to person, place, and time. She appears well-developed and well-nourished.  HENT:  Head: Normocephalic and atraumatic.  Eyes: EOM are normal.  Neck: Neck supple.  Cardiovascular: Normal rate and regular rhythm.   Pulmonary/Chest: Effort normal and breath sounds normal.  Abdominal: Soft. Bowel sounds are normal. There is tenderness in the right upper quadrant and epigastric area. There is positive Murphy's sign. There is no rebound and no CVA tenderness.  Musculoskeletal: Normal range of motion.  Neurological: She is alert and oriented to person, place, and time. No cranial nerve deficit.  Skin: Skin is warm and dry.  Psychiatric: She has a normal mood and affect. Her behavior is normal.  Nursing note and vitals reviewed.   ED Course  Procedures (including critical care time) Labs Review Labs Reviewed  URINALYSIS, ROUTINE W REFLEX MICROSCOPIC (NOT AT [facility]) - Abnormal; Notable for the following:    Specific Gravity, Urine <1.005 (*)    All other components within normal limits  CBC WITH DIFFERENTIAL/PLATELET - Abnormal; Notable for the following:    RBC 5.51 (*)    MCV 67.9 (*)    MCH 22.9 (*)    RDW 15.8 (*)    All other components within normal limits  COMPREHENSIVE METABOLIC PANEL - Abnormal; Notable for the following:    Glucose, Bld 107 (*)    AST 352 (*)    ALT 286 (*)    Total Bilirubin 1.7 (*)    All other components within normal limits  LIPASE, BLOOD  POCT PREGNANCY, URINE    Imaging Review US Abdomen Limited  02/19/2016  CLINICAL DATA:  Right upper quadrant pain EXAM: US ABDOMEN LIMITED - RIGHT UPPER QUADRANT COMPARISON:  None. FINDINGS: Gallbladder: Gallstones are noted within gallbladder the largest measures 5 mm. There is trace pericholecystic fluid. The patient is tender when scanning gallbladder region. Early cholecystitis cannot be excluded. Clinical correlation is necessary.  Common bile duct: Diameter: 3 mm in diameter within normal limits. Liver: No focal lesion identified. Within normal limits in parenchymal echogenicity. IMPRESSION: Gallstones are noted within gallbladder the largest measures 5 mm. There is trace pericholecystic fluid. The patient is tender when scanning gallbladder region. Early cholecystitis cannot be excluded. Clinical correlation is necessary. Normal CBD. Electronically Signed   By: Natasha Mead M.D.   On: 02/19/2016 14:04   I have personally reviewed and evaluated these images and lab results as part of my medical decision-making.   Consult with Dr. Steele Berg, I will call general surgery and transfer patient to [facility] for further evaluation.  1420 Consult with general surgery and patient to go to [facility] via private care.  Dr. Radford Pax, EDP notified.   MDM  31 y.o. female with epigastric pain and RUQ pain and elevated LFT's  stable [facility]. Discussed with the patient and all questioned fully answered. She agrees with plan of care.    Final diagnoses:  Cholecystitis, acute

## 2016-02-19 NOTE — ED Notes (Signed)
[facility] and +US for gall bladder stones.  Not in acute distress or pain at this time.

## 2016-02-19 NOTE — H&P (Addendum)
Emily Mason is an 30 y.o. female.   Chief Complaint: abdominal pain HPI: 30 yo female who has had intermittent epigastric pain for the last 3 months. It generally lasted 1-2 hours and then resolved. This morning she woke from the pain and it continued throughout the day. Since treatment here it has gotten better but still present. She did have nausea and dry heaves yesterday. No fevers or chills, no constipation  Past Medical History  Diagnosis Date  . UTI (lower urinary tract infection)     Past Surgical History  Procedure Laterality Date  . Wisdom tooth extraction      Family History  Problem Relation Age of Onset  . Alcohol abuse Neg Hx   . Arthritis Neg Hx   . Asthma Neg Hx   . Birth defects Neg Hx   . Cancer Neg Hx   . COPD Neg Hx   . Depression Neg Hx   . Diabetes Neg Hx   . Drug abuse Neg Hx   . Early death Neg Hx   . Hearing loss Neg Hx   . Heart disease Neg Hx   . Hyperlipidemia Neg Hx   . Hypertension Neg Hx   . Kidney disease Neg Hx   . Learning disabilities Neg Hx   . Mental illness Neg Hx   . Mental retardation Neg Hx   . Miscarriages / Stillbirths Neg Hx   . Vision loss Neg Hx   . Stroke Neg Hx   . Varicose Veins Neg Hx    Social History:  reports that she has never smoked. She has never used smokeless tobacco. She reports that she does not drink alcohol or use illicit drugs.  Allergies: No Known Allergies   (Not in a hospital admission)  Results for orders placed or performed during the hospital encounter of 02/19/16 (from the past 48 hour(s))  Urinalysis, Routine w reflex microscopic (not at [facility])     Status: Abnormal   Collection Time: 02/19/16 11:30 AM  Result Value Ref Range   Color, Urine YELLOW YELLOW   APPearance CLEAR CLEAR   Specific Gravity, Urine <1.005 (L) 1.005 - 1.030   pH 7.0 5.0 - 8.0   Glucose, UA NEGATIVE NEGATIVE mg/dL   Hgb urine dipstick NEGATIVE NEGATIVE   Bilirubin Urine NEGATIVE NEGATIVE   Ketones, ur NEGATIVE NEGATIVE  mg/dL   Protein, ur NEGATIVE NEGATIVE mg/dL   Nitrite NEGATIVE NEGATIVE   Leukocytes, UA NEGATIVE NEGATIVE    Comment: MICROSCOPIC NOT DONE ON URINES WITH NEGATIVE PROTEIN, BLOOD, LEUKOCYTES, NITRITE, OR GLUCOSE <1000 mg/dL.  Pregnancy, urine POC     Status: None   Collection Time: 02/19/16 11:50 AM  Result Value Ref Range   Preg Test, Ur NEGATIVE NEGATIVE    Comment:        THE SENSITIVITY OF THIS METHODOLOGY IS >24 mIU/mL   CBC with Differential/Platelet     Status: Abnormal   Collection Time: 02/19/16 12:31 PM  Result Value Ref Range   WBC 6.0 4.0 - 10.5 K/uL   RBC 5.51 (H) 3.87 - 5.11 MIL/uL   Hemoglobin 12.6 12.0 - 15.0 g/dL   HCT 37.4 36.0 - 46.0 %   MCV 67.9 (L) 78.0 - 100.0 fL   MCH 22.9 (L) 26.0 - 34.0 pg   MCHC 33.7 30.0 - 36.0 g/dL   RDW 15.8 (H) 11.5 - 15.5 %   Platelets 240 150 - 400 K/uL    Comment: SPECIMEN CHECKED FOR CLOTS REPEATED TO VERIFY  Neutrophils Relative % 69 %   Neutro Abs 4.2 1.7 - 7.7 K/uL   Lymphocytes Relative 24 %   Lymphs Abs 1.5 0.7 - 4.0 K/uL   Monocytes Relative 6 %   Monocytes Absolute 0.4 0.1 - 1.0 K/uL   Eosinophils Relative 0 %   Eosinophils Absolute 0.0 0.0 - 0.7 K/uL   Basophils Relative 0 %   Basophils Absolute 0.0 0.0 - 0.1 K/uL  Comprehensive metabolic panel     Status: Abnormal   Collection Time: 02/19/16 12:31 PM  Result Value Ref Range   Sodium 136 135 - 145 mmol/L   Potassium 4.6 3.5 - 5.1 mmol/L   Chloride 104 101 - 111 mmol/L   CO2 26 22 - 32 mmol/L   Glucose, Bld 107 (H) 65 - 99 mg/dL   BUN 17 6 - 20 mg/dL   Creatinine, Ser 0.56 0.44 - 1.00 mg/dL   Calcium 9.1 8.9 - 10.3 mg/dL   Total Protein 7.8 6.5 - 8.1 g/dL   Albumin 4.4 3.5 - 5.0 g/dL   AST 352 (H) 15 - 41 U/L   ALT 286 (H) 14 - 54 U/L   Alkaline Phosphatase 113 38 - 126 U/L   Total Bilirubin 1.7 (H) 0.3 - 1.2 mg/dL   GFR calc non Af Amer >60 >60 mL/min   GFR calc Af Amer >60 >60 mL/min    Comment: (NOTE) The eGFR has been calculated using the CKD EPI  equation. This calculation has not been validated in all clinical situations. eGFR's persistently <60 mL/min signify possible Chronic Kidney Disease.    Anion gap 6 5 - 15  Lipase, blood     Status: None   Collection Time: 02/19/16 12:31 PM  Result Value Ref Range   Lipase 27 11 - 51 U/L   US Abdomen Limited  02/19/2016  CLINICAL DATA:  Right upper quadrant pain EXAM: US ABDOMEN LIMITED - RIGHT UPPER QUADRANT COMPARISON:  None. FINDINGS: Gallbladder: Gallstones are noted within gallbladder the largest measures 5 mm. There is trace pericholecystic fluid. The patient is tender when scanning gallbladder region. Early cholecystitis cannot be excluded. Clinical correlation is necessary. Common bile duct: Diameter: 3 mm in diameter within normal limits. Liver: No focal lesion identified. Within normal limits in parenchymal echogenicity. IMPRESSION: Gallstones are noted within gallbladder the largest measures 5 mm. There is trace pericholecystic fluid. The patient is tender when scanning gallbladder region. Early cholecystitis cannot be excluded. Clinical correlation is necessary. Normal CBD. Electronically Signed   By: Lahoma Crocker M.D.   On: 02/19/2016 14:04    Review of Systems  Constitutional: Negative for fever and chills.  HENT: Negative for hearing loss.   Eyes: Negative for blurred vision and double vision.  Respiratory: Negative for cough and hemoptysis.   Cardiovascular: Negative for chest pain and palpitations.  Gastrointestinal: Negative for nausea, vomiting and abdominal pain.  Genitourinary: Negative for dysuria and urgency.  Musculoskeletal: Negative for myalgias and neck pain.  Skin: Negative for itching and rash.  Neurological: Negative for dizziness, tingling and headaches.  Endo/Heme/Allergies: Does not bruise/bleed easily.  Psychiatric/Behavioral: Negative for depression and suicidal ideas.    Blood pressure 116/75, pulse 74, temperature 98.3 F (36.8 C), temperature source  Oral, resp. rate 22, height 5' 5"  (1.651 m), weight 86.456 kg (190 lb 9.6 oz), last menstrual period 02/06/2016, SpO2 96 %, currently breastfeeding. Physical Exam  Vitals reviewed. Constitutional: She is oriented to person, place, and time. She appears well-developed and well-nourished.  HENT:  Head: Normocephalic and atraumatic.  Eyes: Conjunctivae and EOM are normal. Pupils are equal, round, and reactive to light.  Neck: Normal range of motion. Neck supple.  Cardiovascular: Normal rate and regular rhythm.   Respiratory: Effort normal and breath sounds normal.  GI: Soft. Bowel sounds are normal. She exhibits no distension. There is tenderness in the right upper quadrant and epigastric area.  Musculoskeletal: Normal range of motion.  Neurological: She is alert and oriented to person, place, and time.  Skin: Skin is warm and dry.  Psychiatric: She has a normal mood and affect. Her behavior is normal.     Assessment/Plan 30 yo female with elevated bilirubin and transaminases, 74m gallbladder wall, normal sized duct, pain improved but still present. Concern for acute cholecystitis. I discussed the plan with patient to admit, recheck labs, plan to remove the gallbladder during this hospitalization. We discussed the details of the procedure, that it is done under general anesthesia, performed laparoscopically, that the gallbladder is removed from the liver and biliary tree, with risks of injury to intestines, liver, biliary tree, and bleeding. She showed understanding and will discuss with her husband. -admit to obs -IV abx -NPO -IV fluids -pain control  LMickeal Skinner MD 02/19/2016, 6:37 PM

## 2016-02-19 NOTE — Progress Notes (Addendum)
CM spoke with pt who confirms uninsured [place] resident with no pcp.  CM discussed and provided written information to assist pt with determining choice for uninsured accepting pcps, discussed the importance of pcp vs EDP services for f/u care, www.needymeds.org, www.goodrx.com, discounted pharmacies and other [place] resources such as [facility] , [facility], affordable care act, financial assistance, uninsured dental services,  med assist, DSS and  health department  Reviewed resources for [place] uninsured accepting pcps like [facility], [facility] [address], [facility], [facility], local urgent care centers, [facility], [facility], general medical clinics, [facility], [facility] plus others, medication resources, [facility] out patient pharmacies and housing Pt voiced understanding and appreciation of resources provided   Provided [facility] contact information 

## 2016-02-20 ENCOUNTER — Encounter (HOSPITAL_COMMUNITY): Payer: Self-pay | Admitting: Certified Registered Nurse Anesthetist

## 2016-02-20 ENCOUNTER — Observation Stay (HOSPITAL_COMMUNITY): Payer: Self-pay

## 2016-02-20 DIAGNOSIS — R1011 Right upper quadrant pain: Secondary | ICD-10-CM

## 2016-02-20 DIAGNOSIS — K8000 Calculus of gallbladder with acute cholecystitis without obstruction: Secondary | ICD-10-CM | POA: Diagnosis not present

## 2016-02-20 DIAGNOSIS — R7989 Other specified abnormal findings of blood chemistry: Secondary | ICD-10-CM

## 2016-02-20 DIAGNOSIS — K810 Acute cholecystitis: Secondary | ICD-10-CM

## 2016-02-20 DIAGNOSIS — R945 Abnormal results of liver function studies: Secondary | ICD-10-CM

## 2016-02-20 LAB — COMPREHENSIVE METABOLIC PANEL
ALK PHOS: 191 U/L — AB (ref 38–126)
ALT: 1171 U/L — ABNORMAL HIGH (ref 14–54)
ANION GAP: 7 (ref 5–15)
AST: 1248 U/L — ABNORMAL HIGH (ref 15–41)
Albumin: 4 g/dL (ref 3.5–5.0)
BILIRUBIN TOTAL: 2.7 mg/dL — AB (ref 0.3–1.2)
BUN: 11 mg/dL (ref 6–20)
CALCIUM: 8.5 mg/dL — AB (ref 8.9–10.3)
CO2: 25 mmol/L (ref 22–32)
Chloride: 105 mmol/L (ref 101–111)
Creatinine, Ser: 0.59 mg/dL (ref 0.44–1.00)
Glucose, Bld: 116 mg/dL — ABNORMAL HIGH (ref 65–99)
Potassium: 3.5 mmol/L (ref 3.5–5.1)
SODIUM: 137 mmol/L (ref 135–145)
TOTAL PROTEIN: 7.5 g/dL (ref 6.5–8.1)

## 2016-02-20 LAB — CBC
HCT: 34.8 % — ABNORMAL LOW (ref 36.0–46.0)
HEMOGLOBIN: 11.5 g/dL — AB (ref 12.0–15.0)
MCH: 22.6 pg — ABNORMAL LOW (ref 26.0–34.0)
MCHC: 33 g/dL (ref 30.0–36.0)
MCV: 68.5 fL — ABNORMAL LOW (ref 78.0–100.0)
PLATELETS: 215 10*3/uL (ref 150–400)
RBC: 5.08 MIL/uL (ref 3.87–5.11)
RDW: 15.6 % — ABNORMAL HIGH (ref 11.5–15.5)
WBC: 3.4 10*3/uL — AB (ref 4.0–10.5)

## 2016-02-20 LAB — SURGICAL PCR SCREEN
MRSA, PCR: NEGATIVE
Staphylococcus aureus: NEGATIVE

## 2016-02-20 LAB — LIPASE, BLOOD: LIPASE: 28 U/L (ref 11–51)

## 2016-02-20 MED ORDER — GADOBENATE DIMEGLUMINE 529 MG/ML IV SOLN
20.0000 mL | Freq: Once | INTRAVENOUS | Status: AC | PRN
  Administered 2016-02-20: 17 mL via INTRAVENOUS

## 2016-02-20 NOTE — Progress Notes (Signed)
  Subjective:  Admitted yesterday with gallstones, abdominal pain, elevated LFTs. Sounds like this is her second episode No history of hepatocellular disease. Otherwise healthy.  Married with 2 children.  Mother is taking care of children right now while husband works.  This morning she is now asymptomatic.  Pain is resolved.  No nausea. Liver function tests have gone up, however.  Transaminases are both greater than 1000.  Bilirubin up to 2.7.  Alkaline phosphatase 191.  WBC 3400.  Hemoglobin 11.5.  Objective: Vital signs in last 24 hours: Temp:  [98 F (36.7 C)-98.8 F (37.1 C)] 98.5 F (36.9 C) (07/12 0458) Pulse Rate:  [57-90] 60 (07/12 0458) Resp:  [15-22] 15 (07/12 0458) BP: (111-136)/(60-95) 111/63 mmHg (07/12 0458) SpO2:  [96 %-100 %] 99 % (07/12 0458) Weight:  [86.456 kg (190 lb 9.6 oz)] 86.456 kg (190 lb 9.6 oz) (07/11 1131) Last BM Date: 02/19/16  Intake/Output from previous day: 07/11 0701 - 07/12 0700 In: 791.3 [I.V.:791.3] Out: 325 [Urine:325] Intake/Output this shift: Total I/O In: 791.3 [I.V.:791.3] Out: 325 [Urine:325]  General appearance: Alert.  Cooperative.  No distress.  Good insight. Resp: clear to auscultation bilaterally GI: Abdomen is soft.  Nontender.  Not distended.  Active bowel sounds.  No mass.  Completely benign.  Lab Results:   Recent Labs  02/19/16 1231 02/20/16 0436  WBC 6.0 3.4*  HGB 12.6 11.5*  HCT 37.4 34.8*  PLT 240 215   BMET  Recent Labs  02/19/16 1231 02/20/16 0436  NA 136 137  K 4.6 3.5  CL 104 105  CO2 26 25  GLUCOSE 107* 116*  BUN 17 11  CREATININE 0.56 0.59  CALCIUM 9.1 8.5*   PT/INR No results for input(s): LABPROT, INR in the last 72 hours. ABG No results for input(s): PHART, HCO3 in the last 72 hours.  Invalid input(s): PCO2, PO2  Studies/Results: [place] Abdomen Limited  02/19/2016  CLINICAL DATA:  Right upper quadrant pain EXAM: US ABDOMEN LIMITED - RIGHT UPPER QUADRANT COMPARISON:  None. FINDINGS:  Gallbladder: Gallstones are noted within gallbladder the largest measures 5 mm. There is trace pericholecystic fluid. The patient is tender when scanning gallbladder region. Early cholecystitis cannot be excluded. Clinical correlation is necessary. Common bile duct: Diameter: 3 mm in diameter within normal limits. Liver: No focal lesion identified. Within normal limits in parenchymal echogenicity. IMPRESSION: Gallstones are noted within gallbladder the largest measures 5 mm. There is trace pericholecystic fluid. The patient is tender when scanning gallbladder region. Early cholecystitis cannot be excluded. Clinical correlation is necessary. Normal CBD. Electronically Signed   By: Natasha MeadLiviu  Pop M.D.   On: 02/19/2016 14:04    Anti-infectives: Anti-infectives    Start     Dose/Rate Route Frequency Ordered Stop   02/19/16 2000  cefTRIAXone (ROCEPHIN) 2 g in dextrose 5 % 50 mL IVPB     2 g 100 mL/hr over 30 Minutes Intravenous Every 24 hours 02/19/16 1843        Assessment/Plan   Gallstones.  Now asymptomatic but with rising LFTs.  This raises the concern of choledocholithiasis.  Less likely hepatocellular disease.  We will hold off on cholecystectomy this morning, and request GI consult to see if we need to consider MRCP, ERCP, or hepatitis screening. Possible cholecystectomy this admission. Repeat labs tomorrow        Ernestene MentionNGRAM,Seanne Chirico M 02/20/2016

## 2016-02-20 NOTE — Consult Note (Signed)
Consultation  Referring Provider:  Dr. Derrell Lolling Primary Care Physician:  No primary care provider on file. Primary Gastroenterologist:   None      Reason for Consultation:  Elevated LFT's, RUQ pain            HPI:   Emily Mason is a 30 y.o. female who was admitted to the hospital on 02/19/2016 for a complaint of intermittent right upper quadrant and epigastric pain since April. The patient had recently been seen at [facility] the morning of 02/19/16 and had elevated liver enzymes as well as gallstones noted on an ultrasound.  This morning, the patient is surrounded by her family and explains that since April of this year she has had once a month cycles of right upper quadrant pain which radiates into her epigastrum, which feels like a "cramp". This may last for one to 3 hours and then resolves spontaneously. The patient describes that this is around the same time that she started having her "monthly cycle" again and figured that this was "just the way it was going to be", after having her baby 9 months ago. The patient's most recent episode was yesterday and seemed to last for a longer amount of time and be more severe. She also had an episode of vomiting per previous physician's notes, but none since 3 AM on 02/19/16.  Today, the patient expresses that she feels somewhat better and has only mild discomfort.  Patient denies weight loss, heartburn, reflux, change in bowel habits, nausea, fever, recent travel, new medications, history of hepatitis, blood transfusions, sick contacts or IV drug use.  Past Medical History  Diagnosis Date  . UTI (lower urinary tract infection)     Past Surgical History  Procedure Laterality Date  . Wisdom tooth extraction      Family History  Problem Relation Age of Onset  . Alcohol abuse Neg Hx   . Arthritis Neg Hx   . Asthma Neg Hx   . Birth defects Neg Hx   . Cancer Neg Hx   . COPD Neg Hx   . Depression Neg Hx   . Diabetes Neg Hx   . Drug  abuse Neg Hx   . Early death Neg Hx   . Hearing loss Neg Hx   . Heart disease Neg Hx   . Hyperlipidemia Neg Hx   . Hypertension Neg Hx   . Kidney disease Neg Hx   . Learning disabilities Neg Hx   . Mental illness Neg Hx   . Mental retardation Neg Hx   . Miscarriages / Stillbirths Neg Hx   . Vision loss Neg Hx   . Stroke Neg Hx   . Varicose Veins Neg Hx     Social History  Substance Use Topics  . Smoking status: Never Smoker   . Smokeless tobacco: Never Used  . Alcohol Use: No    Prior to Admission medications   Medication Sig Start Date End Date Taking? Authorizing Provider  polyethylene glycol (MIRALAX / GLYCOLAX) packet Take 17 g by mouth once.    Yes Historical Provider, MD  Prenatal Vit-Fe Fumarate-FA (PRENATAL MULTIVITAMIN) TABS tablet Take 1 tablet by mouth daily.    Yes Historical Provider, MD  ibuprofen (ADVIL,MOTRIN) 800 MG tablet Take 1 tablet (800 mg total) by mouth every 6 (six) hours as needed for moderate pain or cramping. 05/26/15   Marlow Baars, MD    Current Facility-Administered Medications  Medication Dose Route Frequency Provider Last Rate  Last Dose  . antiseptic oral rinse (CPC / CETYLPYRIDINIUM CHLORIDE 0.05%) solution 7 mL  7 mL Mouth Rinse q12n4p De BlanchLuke Aaron Kinsinger, MD      . cefTRIAXone (ROCEPHIN) 2 g in dextrose 5 % 50 mL IVPB  2 g Intravenous Q24H Rodman PickleLuke Aaron Kinsinger, MD 100 mL/hr at 02/19/16 1921 2 g at 02/19/16 1921  . chlorhexidine (PERIDEX) 0.12 % solution 15 mL  15 mL Mouth Rinse BID Rodman PickleLuke Aaron Kinsinger, MD   15 mL at 02/19/16 2200  . dextrose 5 % and 0.45 % NaCl with KCl 20 mEq/L infusion   Intravenous Continuous Rodman PickleLuke Aaron Kinsinger, MD 75 mL/hr at 02/19/16 1924    . morphine 2 MG/ML injection 2-4 mg  2-4 mg Intravenous Q1H PRN De BlanchLuke Aaron Kinsinger, MD      . ondansetron (ZOFRAN-ODT) disintegrating tablet 4 mg  4 mg Oral Q6H PRN Rodman PickleLuke Aaron Kinsinger, MD       Or  . ondansetron [facility](ZOFRAN) injection 4 mg  4 mg Intravenous Q6H PRN De BlanchLuke Aaron  Kinsinger, MD      . oxyCODONE (Oxy IR/ROXICODONE) immediate release tablet 5-10 mg  5-10 mg Oral Q4H PRN Rodman PickleLuke Aaron Kinsinger, MD        Allergies as of 02/19/2016  . (No Known Allergies)     Review of Systems:    Constitutional: No weight loss, fever or chills Skin: No new rash or color change Cardiovascular: No chest pain or palpitations     Respiratory: No SOB or cough Gastrointestinal: See HPI and otherwise negative Genitourinary: No dysuria  Neurological: No headache or dizziness Musculoskeletal: No muscle or back pain Hematologic: No anemia or bleeding Psychiatric: No history of depression or anxiety    Physical Exam:  Vital signs in last 24 hours: Temp:  [98 F (36.7 C)-98.8 F (37.1 C)] 98.5 F (36.9 C) (07/12 0458) Pulse Rate:  [57-90] 60 (07/12 0458) Resp:  [15-22] 15 (07/12 0458) BP: (111-136)/(60-95) 111/63 mmHg (07/12 0458) SpO2:  [96 %-100 %] 99 % (07/12 0458) Weight:  [190 lb 9.6 oz (86.456 kg)] 190 lb 9.6 oz (86.456 kg) (07/11 1131) Last BM Date: 02/19/16 General:   Pleasant female appears to be in NAD, Well developed, Well nourished, alert and cooperative Head:  Normocephalic and atraumatic. Eyes:   PEERL, EOMI. No icterus. Conjunctiva pink. Ears:  Normal auditory acuity. Neck:  Supple Throat: Oral cavity and pharynx without inflammation, swelling or lesion. Teeth in good condition. Lungs: Respirations even and unlabored. Lungs clear to auscultation bilaterally.   No wheezes, crackles, or rhonchi.  Heart: Normal S1, S2. No MRG. Regular rate and rhythm. No peripheral edema, cyanosis or pallor.  Abdomen:  Soft, nondistended, nontender. No rebound or guarding. Normal bowel sounds. No appreciable masses or hepatomegaly. Rectal:  Not performed.  Msk:  Symmetrical without gross deformities. Peripheral pulses intact.  Extremities:  Without edema, no deformity or joint abnormality. Normal ROM Neurologic:  Alert and  oriented x4;  grossly normal neurologically.    Skin:   Dry and intact without significant lesions or rashes. Psychiatric: Oriented to person, place and time. Demonstrates good judgement and reason without abnormal affect or behaviors.   LAB RESULTS:  Recent Labs  02/19/16 1231 02/20/16 0436  WBC 6.0 3.4*  HGB 12.6 11.5*  HCT 37.4 34.8*  PLT 240 215   BMET  Recent Labs  02/19/16 1231 02/20/16 0436  NA 136 137  K 4.6 3.5  CL 104 105  CO2 26 25  GLUCOSE 107* 116*  BUN 17 11  CREATININE 0.56 0.59  CALCIUM 9.1 8.5*   LFT  Recent Labs  02/20/16 0436  PROT 7.5  ALBUMIN 4.0  AST 1248*  ALT 1171*  ALKPHOS 191*  BILITOT 2.7*   PT/INR No results for input(s): LABPROT, INR in the last 72 hours.  STUDIES: US Abdomen Limited  02/19/2016  CLINICAL DATA:  Right upper quadrant pain EXAM: US ABDOMEN LIMITED - RIGHT UPPER QUADRANT COMPARISON:  None. FINDINGS: Gallbladder: Gallstones are noted within gallbladder the largest measures 5 mm. There is trace pericholecystic fluid. The patient is tender when scanning gallbladder region. Early cholecystitis cannot be excluded. Clinical correlation is necessary. Common bile duct: Diameter: 3 mm in diameter within normal limits. Liver: No focal lesion identified. Within normal limits in parenchymal echogenicity. IMPRESSION: Gallstones are noted within gallbladder the largest measures 5 mm. There is trace pericholecystic fluid. The patient is tender when scanning gallbladder region. Early cholecystitis cannot be excluded. Clinical correlation is necessary. Normal CBD. Electronically Signed   By: Natasha Mead M.D.   On: 02/19/2016 14:04     PREVIOUS ENDOSCOPIES:            none   Impression / Plan:  Impression: 1.  Cholecystitis: Ultrasound on 02/19/2016 reveals gallstones; h/o episodic RUQ pain;Question of choledocholithiasis based on increase in alkaline phosphatase and bilirubin as well as transaminases overnight 2. Intermittent right upper quadrant pain: Likely related to  cholecystitis and possible choledocholithiasis 3. Elevated LFTs: Related to above  Plan: 1. Ordered MRCP for further evaluation of choledocholithiasis, if this is positive, will proceed with ERCP tomorrow 2. Spent time discussing the pathophysiology of patient's disease with her and her family, ample time was given for questions and discussion 3. Continue supportive measures 4. Continue antibiotics 5. Ordered acute hepatitis panel 6. Discussed above with Dr. Myrtie Neither  Thank you for your kind consultation, we will continue to follow.  Violet Baldy Tametra Ahart  02/20/2016, 10:03 AM Pager #: [phone]

## 2016-02-21 ENCOUNTER — Inpatient Hospital Stay (HOSPITAL_COMMUNITY): Payer: Self-pay

## 2016-02-21 ENCOUNTER — Inpatient Hospital Stay (HOSPITAL_COMMUNITY): Payer: Self-pay | Admitting: Anesthesiology

## 2016-02-21 ENCOUNTER — Encounter (HOSPITAL_COMMUNITY): Payer: Self-pay | Admitting: General Surgery

## 2016-02-21 ENCOUNTER — Encounter (HOSPITAL_COMMUNITY): Admission: AD | Disposition: A | Discharge status: Home / Self Care | Payer: Self-pay | Source: Ambulatory Visit

## 2016-02-21 DIAGNOSIS — K8000 Calculus of gallbladder with acute cholecystitis without obstruction: Secondary | ICD-10-CM | POA: Diagnosis not present

## 2016-02-21 DIAGNOSIS — R7989 Other specified abnormal findings of blood chemistry: Secondary | ICD-10-CM | POA: Diagnosis not present

## 2016-02-21 DIAGNOSIS — R1011 Right upper quadrant pain: Secondary | ICD-10-CM | POA: Diagnosis not present

## 2016-02-21 HISTORY — PX: CHOLECYSTECTOMY: SHX55

## 2016-02-21 LAB — COMPREHENSIVE METABOLIC PANEL
ALBUMIN: 3.9 g/dL (ref 3.5–5.0)
ALT: 911 U/L — AB (ref 14–54)
AST: 402 U/L — AB (ref 15–41)
Alkaline Phosphatase: 196 U/L — ABNORMAL HIGH (ref 38–126)
Anion gap: 4 — ABNORMAL LOW (ref 5–15)
BUN: 7 mg/dL (ref 6–20)
CHLORIDE: 107 mmol/L (ref 101–111)
CO2: 27 mmol/L (ref 22–32)
CREATININE: 0.63 mg/dL (ref 0.44–1.00)
Calcium: 8.8 mg/dL — ABNORMAL LOW (ref 8.9–10.3)
GFR calc Af Amer: >60 mL/min (ref >60)
GFR calc non Af Amer: >60 mL/min (ref >60)
Glucose, Bld: 114 mg/dL — ABNORMAL HIGH (ref 65–99)
POTASSIUM: 4 mmol/L (ref 3.5–5.1)
SODIUM: 138 mmol/L (ref 135–145)
Total Bilirubin: 1.2 mg/dL (ref 0.3–1.2)
Total Protein: 7.2 g/dL (ref 6.5–8.1)

## 2016-02-21 LAB — CBC
HCT: 34.3 % — ABNORMAL LOW (ref 36.0–46.0)
HEMOGLOBIN: 11.2 g/dL — AB (ref 12.0–15.0)
MCH: 22.5 pg — AB (ref 26.0–34.0)
MCHC: 32.7 g/dL (ref 30.0–36.0)
MCV: 68.9 fL — ABNORMAL LOW (ref 78.0–100.0)
Platelets: 223 10*3/uL (ref 150–400)
RBC: 4.98 MIL/uL (ref 3.87–5.11)
RDW: 15.9 % — ABNORMAL HIGH (ref 11.5–15.5)
WBC: 5.2 10*3/uL (ref 4.0–10.5)

## 2016-02-21 LAB — LIPASE, BLOOD: Lipase: 24 U/L (ref 11–51)

## 2016-02-21 LAB — HEPATITIS PANEL, ACUTE
HCV Ab: <0.1 s/co ratio (ref 0.0–0.9)
HEP A IGM: NEGATIVE
Hep B C IgM: NEGATIVE
Hepatitis B Surface Ag: NEGATIVE

## 2016-02-21 LAB — PROTIME-INR
INR: 1.11 (ref 0.00–1.49)
Prothrombin Time: 14.1 seconds (ref 11.6–15.2)

## 2016-02-21 SURGERY — LAPAROSCOPIC CHOLECYSTECTOMY WITH INTRAOPERATIVE CHOLANGIOGRAM
Anesthesia: General | Site: Abdomen

## 2016-02-21 SURGERY — ERCP, WITH INTERVENTION IF INDICATED
Anesthesia: General

## 2016-02-21 MED ORDER — ROCURONIUM BROMIDE 100 MG/10ML IV SOLN
INTRAVENOUS | Status: AC
  Filled 2016-02-21: qty 1

## 2016-02-21 MED ORDER — DEXAMETHASONE SODIUM PHOSPHATE 10 MG/ML IJ SOLN
INTRAMUSCULAR | Status: DC | PRN
  Administered 2016-02-21: 10 mg via INTRAVENOUS

## 2016-02-21 MED ORDER — CEFAZOLIN SODIUM-DEXTROSE 2-3 GM-% IV SOLR
2.0000 g | Freq: Once | INTRAVENOUS | Status: AC
  Administered 2016-02-21: 2 g via INTRAVENOUS

## 2016-02-21 MED ORDER — HYDROMORPHONE HCL 1 MG/ML IJ SOLN
INTRAMUSCULAR | Status: AC
  Filled 2016-02-21: qty 1

## 2016-02-21 MED ORDER — ONDANSETRON HCL 4 MG/2ML IJ SOLN
INTRAMUSCULAR | Status: DC | PRN
  Administered 2016-02-21: 4 mg via INTRAVENOUS

## 2016-02-21 MED ORDER — IOPAMIDOL (ISOVUE-300) INJECTION 61%
INTRAVENOUS | Status: AC
  Filled 2016-02-21: qty 50

## 2016-02-21 MED ORDER — IOPAMIDOL (ISOVUE-300) INJECTION 61%
INTRAVENOUS | Status: DC | PRN
  Administered 2016-02-21: 50 mL via INTRAVENOUS

## 2016-02-21 MED ORDER — PROPOFOL 10 MG/ML IV BOLUS
INTRAVENOUS | Status: DC | PRN
  Administered 2016-02-21: 200 mg via INTRAVENOUS

## 2016-02-21 MED ORDER — BUPIVACAINE-EPINEPHRINE 0.5% -1:200000 IJ SOLN
INTRAMUSCULAR | Status: AC
  Filled 2016-02-21: qty 1

## 2016-02-21 MED ORDER — PROPOFOL 10 MG/ML IV BOLUS
INTRAVENOUS | Status: AC
  Filled 2016-02-21: qty 20

## 2016-02-21 MED ORDER — SUCCINYLCHOLINE CHLORIDE 20 MG/ML IJ SOLN
INTRAMUSCULAR | Status: DC | PRN
  Administered 2016-02-21: 100 mg via INTRAVENOUS

## 2016-02-21 MED ORDER — POLYETHYLENE GLYCOL 3350 17 G PO PACK: 17.0000 g | PACK | Freq: Every day | ORAL | Status: DC | PRN

## 2016-02-21 MED ORDER — PROMETHAZINE HCL 25 MG/ML IJ SOLN: 6.2500 mg | INTRAMUSCULAR | Status: DC | PRN

## 2016-02-21 MED ORDER — BUPIVACAINE-EPINEPHRINE 0.5% -1:200000 IJ SOLN
INTRAMUSCULAR | Status: DC | PRN
  Administered 2016-02-21: 20 mL

## 2016-02-21 MED ORDER — CEFAZOLIN SODIUM-DEXTROSE 2-4 GM/100ML-% IV SOLN
INTRAVENOUS | Status: AC
  Filled 2016-02-21: qty 100

## 2016-02-21 MED ORDER — ONDANSETRON HCL 4 MG/2ML IJ SOLN
INTRAMUSCULAR | Status: AC
  Filled 2016-02-21: qty 2

## 2016-02-21 MED ORDER — MIDAZOLAM HCL 5 MG/5ML IJ SOLN
INTRAMUSCULAR | Status: DC | PRN
  Administered 2016-02-21: 2 mg via INTRAVENOUS

## 2016-02-21 MED ORDER — FENTANYL CITRATE (PF) 100 MCG/2ML IJ SOLN: 25.0000 ug | INTRAMUSCULAR | Status: DC | PRN

## 2016-02-21 MED ORDER — GLYCOPYRROLATE 0.2 MG/ML IJ SOLN
INTRAMUSCULAR | Status: AC
  Filled 2016-02-21: qty 3

## 2016-02-21 MED ORDER — FENTANYL CITRATE (PF) 250 MCG/5ML IJ SOLN
INTRAMUSCULAR | Status: AC
  Filled 2016-02-21: qty 5

## 2016-02-21 MED ORDER — DEXAMETHASONE SODIUM PHOSPHATE 10 MG/ML IJ SOLN
INTRAMUSCULAR | Status: AC
  Filled 2016-02-21: qty 1

## 2016-02-21 MED ORDER — GLYCOPYRROLATE 0.2 MG/ML IJ SOLN
INTRAMUSCULAR | Status: DC | PRN
  Administered 2016-02-21: 0.6 mg via INTRAVENOUS

## 2016-02-21 MED ORDER — LACTATED RINGERS IV SOLN
INTRAVENOUS | Status: DC | PRN
Start: 2016-02-21 — End: 2016-02-21
  Administered 2016-02-21 (×2): via INTRAVENOUS

## 2016-02-21 MED ORDER — LACTATED RINGERS IR SOLN
Status: DC | PRN
  Administered 2016-02-21: 1000 mL

## 2016-02-21 MED ORDER — HYDROMORPHONE HCL 1 MG/ML IJ SOLN
0.5000 mg | INTRAMUSCULAR | Status: AC | PRN
  Administered 2016-02-21: 0.25 mg via INTRAVENOUS
  Administered 2016-02-21 (×2): 0.5 mg via INTRAVENOUS
  Administered 2016-02-21: 0.25 mg via INTRAVENOUS

## 2016-02-21 MED ORDER — MIDAZOLAM HCL 2 MG/2ML IJ SOLN
INTRAMUSCULAR | Status: AC
  Filled 2016-02-21: qty 2

## 2016-02-21 MED ORDER — ENOXAPARIN SODIUM 40 MG/0.4ML ~~LOC~~ SOLN: 40.0000 mg | SUBCUTANEOUS | Status: DC

## 2016-02-21 MED ORDER — NEOSTIGMINE METHYLSULFATE 10 MG/10ML IV SOLN
INTRAVENOUS | Status: AC
  Filled 2016-02-21: qty 1

## 2016-02-21 MED ORDER — HYDROCODONE-ACETAMINOPHEN 5-325 MG PO TABS
1.0000 | ORAL_TABLET | ORAL | Status: DC | PRN
  Administered 2016-02-22: 1 via ORAL
  Filled 2016-02-21: qty 1

## 2016-02-21 MED ORDER — LACTATED RINGERS IV SOLN
INTRAVENOUS | Status: DC
  Administered 2016-02-21: 21:00:00 via INTRAVENOUS

## 2016-02-21 MED ORDER — NEOSTIGMINE METHYLSULFATE 10 MG/10ML IV SOLN
INTRAVENOUS | Status: DC | PRN
  Administered 2016-02-21: 4 mg via INTRAVENOUS

## 2016-02-21 MED ORDER — LIDOCAINE HCL (CARDIAC) 20 MG/ML IV SOLN
INTRAVENOUS | Status: DC | PRN
  Administered 2016-02-21: 100 mg via INTRAVENOUS

## 2016-02-21 MED ORDER — FAMOTIDINE IN NACL 20-0.9 MG/50ML-% IV SOLN
20.0000 mg | Freq: Two times a day (BID) | INTRAVENOUS | Status: DC
  Administered 2016-02-21 (×2): 20 mg via INTRAVENOUS
  Filled 2016-02-21 (×4): qty 50

## 2016-02-21 MED ORDER — ONDANSETRON 4 MG PO TBDP: 4.0000 mg | ORAL_TABLET | Freq: Four times a day (QID) | ORAL | Status: DC | PRN

## 2016-02-21 MED ORDER — ONDANSETRON HCL 4 MG/2ML IJ SOLN
4.0000 mg | Freq: Four times a day (QID) | INTRAMUSCULAR | Status: DC | PRN
  Filled 2016-02-21: qty 2

## 2016-02-21 MED ORDER — LIDOCAINE HCL (CARDIAC) 20 MG/ML IV SOLN
INTRAVENOUS | Status: AC
  Filled 2016-02-21: qty 5

## 2016-02-21 MED ORDER — FENTANYL CITRATE (PF) 100 MCG/2ML IJ SOLN
INTRAMUSCULAR | Status: DC | PRN
  Administered 2016-02-21: 50 ug via INTRAVENOUS
  Administered 2016-02-21 (×2): 100 ug via INTRAVENOUS

## 2016-02-21 MED ORDER — ROCURONIUM BROMIDE 100 MG/10ML IV SOLN
INTRAVENOUS | Status: DC | PRN
  Administered 2016-02-21 (×3): 10 mg via INTRAVENOUS
  Administered 2016-02-21: 30 mg via INTRAVENOUS

## 2016-02-21 MED ORDER — BISACODYL 5 MG PO TBEC: 5.0000 mg | DELAYED_RELEASE_TABLET | Freq: Every day | ORAL | Status: DC | PRN

## 2016-02-21 MED ORDER — 0.9 % SODIUM CHLORIDE (POUR BTL) OPTIME
TOPICAL | Status: DC | PRN
  Administered 2016-02-21: 1000 mL

## 2016-02-21 MED ORDER — HYDROMORPHONE HCL 1 MG/ML IJ SOLN
1.0000 mg | INTRAMUSCULAR | Status: DC | PRN
  Administered 2016-02-21: 1 mg via INTRAVENOUS
  Filled 2016-02-21: qty 1

## 2016-02-21 SURGICAL SUPPLY — 38 items
ADH SKN CLS APL DERMABOND .7 (GAUZE/BANDAGES/DRESSINGS) ×1
APPLIER CLIP ROT 10 11.4 M/L (STAPLE) ×3
APR CLP MED LRG 11.4X10 (STAPLE) ×1
BAG RETRIEVAL 10 (BASKET) ×1
BAG RETRIEVAL 10MM (BASKET) ×1
BAG SPEC RTRVL LRG 6X4 10 (ENDOMECHANICALS)
CABLE HIGH FREQUENCY MONO STRZ (ELECTRODE) ×3 IMPLANT
CLIP APPLIE ROT 10 11.4 M/L (STAPLE) ×1 IMPLANT
COVER MAYO STAND STRL (DRAPES) ×3 IMPLANT
COVER SURGICAL LIGHT HANDLE (MISCELLANEOUS) ×3 IMPLANT
DECANTER SPIKE VIAL GLASS SM (MISCELLANEOUS) ×3 IMPLANT
DERMABOND ADVANCED (GAUZE/BANDAGES/DRESSINGS) ×2
DERMABOND ADVANCED .7 DNX12 (GAUZE/BANDAGES/DRESSINGS) ×1 IMPLANT
DRAPE C-ARM 42X120 X-RAY (DRAPES) ×3 IMPLANT
DRAPE LAPAROSCOPIC ABDOMINAL (DRAPES) ×3 IMPLANT
ELECT REM PT RETURN 9FT ADLT (ELECTROSURGICAL) ×3
ELECTRODE REM PT RTRN 9FT ADLT (ELECTROSURGICAL) ×1 IMPLANT
GLOVE EUDERMIC 7 POWDERFREE (GLOVE) ×3 IMPLANT
GOWN STRL REUS W/TWL XL LVL3 (GOWN DISPOSABLE) ×12 IMPLANT
HEMOSTAT SNOW SURGICEL 2X4 (HEMOSTASIS) IMPLANT
KIT BASIN OR (CUSTOM PROCEDURE TRAY) ×3 IMPLANT
POSITIONER SURGICAL ARM (MISCELLANEOUS) IMPLANT
POUCH SPECIMEN RETRIEVAL 10MM (ENDOMECHANICALS) ×1 IMPLANT
SCISSORS LAP 5X35 DISP (ENDOMECHANICALS) ×3 IMPLANT
SET CHOLANGIOGRAPH MIX (MISCELLANEOUS) ×3 IMPLANT
SET IRRIG TUBING LAPAROSCOPIC (IRRIGATION / IRRIGATOR) ×3 IMPLANT
SLEEVE XCEL OPT CAN 5 100 (ENDOMECHANICALS) ×3 IMPLANT
SUT MNCRL AB 4-0 PS2 18 (SUTURE) ×3 IMPLANT
SYS BAG RETRIEVAL 10MM (BASKET) ×1
SYSTEM BAG RETRIEVAL 10MM (BASKET) IMPLANT
TAPE CLOTH 4X10 WHT NS (GAUZE/BANDAGES/DRESSINGS) IMPLANT
TOWEL OR 17X26 10 PK STRL BLUE (TOWEL DISPOSABLE) ×3 IMPLANT
TOWEL OR NON WOVEN STRL DISP B (DISPOSABLE) ×3 IMPLANT
TRAY LAPAROSCOPIC (CUSTOM PROCEDURE TRAY) ×3 IMPLANT
TROCAR BLADELESS OPT 5 100 (ENDOMECHANICALS) ×3 IMPLANT
TROCAR XCEL BLUNT TIP 100MML (ENDOMECHANICALS) ×3 IMPLANT
TROCAR XCEL NON-BLD 11X100MML (ENDOMECHANICALS) ×3 IMPLANT
TUBING INSUF HEATED (TUBING) ×3 IMPLANT

## 2016-02-21 NOTE — Op Note (Addendum)
Patient Name:           Emily Mason   Date of Surgery:        02/21/2016  Pre op Diagnosis:      Acute cholecystitis with cholelithiasis, abnormal liver function tests  Post op Diagnosis:    Same  Procedure:                 Laparoscopic cholecystectomy with cholangiogram  Surgeon:                     Angelia MouldHaywood M. Derrell LollingIngram, M.D., FACS  Assistant:                      OR staff  Operative Indications:   This is a healthy 30 year old female who presented to the hospital 48 hours ago.  She gave a history of mild intermittent epigastric pain for 3 months.  The day of admission the pain persisted and was surgery with nausea.  Gallbladder ultrasound showed trace pericholecystic fluid, gallstones within the gallbladder, common bile duct small at 3 mm.  Liver function test showed bilirubin of 1.7 and AST and ALTs in the 300 range.  Overnight her symptoms completely resolved and her abdomen was nontender.  On hospital day #2 her transaminases were over 1000 and her bilirubin was up to 2.7.  Gastroenterology was consulted.  MRCP showed no evidence of choledocholithiasis.  The decision was made to proceed with laparoscopic cholecystectomy with cholangiogram.  This morning her liver function test has started to come back down and the bilirubin was 1.2.  She possibly passed a CBD stone.  Operative Findings:       The gallbladder was somewhat edematous but there was no necrosis or purulence.  The liver was healthy.  The degree of gallbladder inflammation and the appearance of her liver did not explain her liver function tests.  Her cholangiogram showed normal intrahepatic and extrahepatic biliary anatomy, nondilated.  Almost no contrast went into the duodenum despite 3 separate runs of the cholangiogram.  There was a question of a filling defect versus artifactual angulation at the intramural portion of the ampulla.  The liver, stomach, duodenum, small and large intestine were grossly normal otherwise.  Procedure  in Detail:          Following the induction of general endotracheal anesthesia the patient's abdomen was prepped and draped in a sterile fashion.  Intravenous antibiotic given.  Surgical timeout was performed.  0.5% Marcaine with epinephrine was used as local infiltration anesthetic.     A vertical incision was made at the lower rim of the umbilicus, the fascia incised in the midline and the abdominal cavity entered under direct vision.  An 11 mm Hassan trochars placed and secured with the Purstring suture of 0 Vicryl.  Pneumoperitoneum was created and a video camera inserted.  Trochars placed in subxiphoid region and 25 mm trochars placed in the right upper quadrant.  The gallbladder fundus was elevated.  The infundibulum was retracted laterally.  The peritoneum overlying the neck of the gallbladder was slowly incised and dissected.  I carefully isolated the cystic artery as it went on to the wall of the gallbladder.  This was secured with multiple medical clips and divided.  I then created a very large window behind the cystic duct and a good critical view.  The cholangiogram catheter was inserted into the cystic duct and a cholangiogram was obtained using the C-arm.  The finding showed  normal intrahepatic and extrahepatic anatomy as described above, but poor emptying into the duodenum.  The significance of this is unknown.  Clear-cut filling defect was not seen following discussion with radiology. The cholangiocatheter was removed, the cystic duct secured with multiple medical clips and divided.  A small posterior branch of the cystic artery was secured with metal clips and divided.  The gallbladder was then dissected free of the liver placed in a specimen bag and removed.  The operative field was copiously irrigated.  Irrigation fluid was completely clear.  There is no bleeding or bile leak.     Pneumoperitoneum was released.  The fascia at the umbilicus was closed with 0 Vicryl sutures.  The skin incisions  were closed with subcuticular sutures of 4-0 Monocryl and Dermabond.  Patient tolerated the procedure well was taken to PACU in stable condition.  EBL 20 mL or less.  Counts correct.  Complications none.     Angelia Mould. Derrell Lolling, M.D., FACS General and Minimally Invasive Surgery Breast and Colorectal Surgery  02/21/2016 11:30 AM

## 2016-02-21 NOTE — Progress Notes (Signed)
[facility] Progress Note  Chief Complaint: Abnormal LFTs  Subjective History:  Emily Mason underwent cholecystectomy with IOC earlier today. Dr. Derrell LollingIngram described the gallbladder as edematous but not necrotic. The patient is having postoperative pain, but otherwise is doing well. She denies nausea or vomiting, and reports the pain medicine is working well. The IOC was given an initial reading by one of the radiologists, and then a somewhat different and final reading by different radiologist. It was initially felt to be normal, and the final reading however indicated a "filling defect in the left common bile duct". I personally reviewed the images and did so again with Dr. Ty HiltsEdmonds in radiology.  ROS: Cardiovascular:  no chest pain Respiratory: no dyspnea  Objective:  Med list reviewed  Vital signs in last 24 hrs: Filed Vitals:   02/21/16 1515 02/21/16 1617  BP: 121/86 118/72  Pulse: 60 56  Temp: 98 F (36.7 C) 98.4 F (36.9 C)  Resp: 15 14    Physical Exam   HEENT: sclera anicteric, oral mucosa moist without lesions  Neck: supple, no thyromegaly, JVD or lymphadenopathy  Cardiac: RRR without murmurs, S1S2 heard, no peripheral edema  Pulm: clear to auscultation bilaterally, normal RR and effort noted  Abdomen: soft,  RUQ tenderness, with active bowel sounds. No guarding or palpable hepatosplenomegaly  Skin; warm and dry, no jaundice or rash  Recent Labs:   Recent Labs Lab 02/19/16 1231 02/20/16 0436 02/21/16 0429  WBC 6.0 3.4* 5.2  HGB 12.6 11.5* 11.2*  HCT 37.4 34.8* 34.3*  PLT 240 215 223    Recent Labs Lab 02/21/16 0429  NA 138  K 4.0  CL 107  CO2 27  BUN 7  ALBUMIN 3.9  ALKPHOS 196*  ALT 911*  AST 402*  GLUCOSE 114*  LFTs are improved from yesterday  Recent Labs Lab 02/21/16 0429  INR 1.11    [facility] as noted above.   The contrast does not pass well into the duodenum, with only a small amount of it seen on  the latter images. But in that area there is a normal tapering of the duct in what is likely to be the intradmural segment. I do not see a clear filling defect. The duct is still normal caliber at 3 mm throughout its course.  @ASSESSMENTPLANBEGIN @ Assessment:  Abnormal LFTs Cholelithiasis Cholecystitis Right upper quadrant pain  I think she most likely passed a common bile duct stone. The degree of LFT elevation is somewhat higher than one expects to see from this, but also higher than one expects to see just from the adjacent hepatic parenchymal inflammation associated with cholecystitis. I do not feel that the imaging is convincing enough for retained CBD stone to currently warrant the risks of ERCP, especially since young woman are more likely to get post ERCP pancreatitis.  Plan: I've spoken with Dr. Derrell LollingIngram, and my plan is to recheck the LFTs in the morning. If they are improving, I will not plan to perform an ERCP.   Charlie PitterHenry L Danis III Pager [phone] Mon-Fri 8a-5p [phone] after 5p, weekends, holidays

## 2016-02-21 NOTE — Discharge Instructions (Signed)
Laparoscopic Cholecystectomy, Care After °Refer to this sheet in the next few weeks. These instructions provide you with information about caring for yourself after your procedure. Your health care provider may also give you more specific instructions. Your treatment has been planned according to current medical practices, but problems sometimes occur. Call your health care provider if you have any problems or questions after your procedure. °WHAT TO EXPECT AFTER THE PROCEDURE °After your procedure, it is common to have: °· Pain at your incision sites. You will be given pain medicines to control your pain. °· Mild nausea or vomiting. This should improve after the first 24 hours. °· Bloating and possible shoulder pain from the gas that was used during the procedure. This will improve after the first 24 hours. °HOME CARE INSTRUCTIONS °Incision Care °· Follow instructions from your health care provider about how to take care of your incisions. Make sure you: °¨ Wash your hands with soap and water before you change your bandage (dressing). If soap and water are not available, use hand sanitizer. °¨ Change your dressing as told by your health care provider. °¨ Leave stitches (sutures), skin glue, or adhesive strips in place. These skin closures may need to be in place for 2 weeks or longer. If adhesive strip edges start to loosen and curl up, you may trim the loose edges. Do not remove adhesive strips completely unless your health care provider tells you to do that. °· Do not take baths, swim, or use a hot tub until your health care provider approves. Ask your health care provider if you can take showers. You may only be allowed to take sponge baths for bathing. °General Instructions °· Take over-the-counter and prescription medicines only as told by your health care provider. °· Do not drive or operate heavy machinery while taking prescription pain medicine. °· Return to your normal diet as told by your health care  provider. °· Do not lift anything that is heavier than 10 lb (4.5 kg). °· Do not play contact sports for one week or until your health care provider approves. °SEEK MEDICAL CARE IF:  °· You have redness, swelling, or pain at the site of your incision. °· You have fluid, blood, or pus coming from your incision. °· You notice a bad smell coming from your incision area. °· Your surgical incisions break open. °· You have a fever. °SEEK IMMEDIATE MEDICAL CARE IF: °· You develop a rash. °· You have difficulty breathing. °· You have chest pain. °· You have increasing pain in your shoulders (shoulder strap areas). °· You faint or have dizzy episodes while you are standing. °· You have severe pain in your abdomen. °· You have nausea or vomiting that lasts for more than one day. °  °This information is not intended to replace advice given to you by your health care provider. Make sure you discuss any questions you have with your health care provider. °  °Document Released: 07/28/2005 Document Revised: 04/18/2015 Document Reviewed: 03/09/2013 °Elsevier Interactive Patient Education ©2016 [employer] °CCS ______[facility] °LAPAROSCOPIC SURGERY: POST OP INSTRUCTIONS °Always review your discharge instruction sheet given to you by the facility where your surgery was performed. °IF YOU HAVE DISABILITY OR FAMILY LEAVE FORMS, YOU MUST BRING THEM TO THE OFFICE FOR PROCESSING.   °DO NOT GIVE THEM TO YOUR DOCTOR. ° °1. A prescription for pain medication may be given to you upon discharge.  Take your pain medication as prescribed, if needed.  If narcotic   pain medicine is not needed, then you may take acetaminophen (Tylenol) or ibuprofen (Advil) as needed. °2. Take your usually prescribed medications unless otherwise directed. °3. If you need a refill on your pain medication, please contact your pharmacy.  They will contact our office to request authorization. Prescriptions will not be filled after 5pm or on  week-ends. °4. You should follow a light diet the first few days after arrival home, such as soup and crackers, etc.  Be sure to include lots of fluids daily. °5. Most patients will experience some swelling and bruising in the area of the incisions.  Ice packs will help.  Swelling and bruising can take several days to resolve.  °6. It is common to experience some constipation if taking pain medication after surgery.  Increasing fluid intake and taking a stool softener (such as Colace) will usually help or prevent this problem from occurring.  A mild laxative (Milk of Magnesia or Miralax) should be taken according to package instructions if there are no bowel movements after 48 hours. °7. Unless discharge instructions indicate otherwise, you may remove your bandages 24-48 hours after surgery, and you may shower at that time.  You may have steri-strips (small skin tapes) in place directly over the incision.  These strips should be left on the skin for 7-10 days.  If your surgeon used skin glue on the incision, you may shower in 24 hours.  The glue will flake off over the next 2-3 weeks.  Any sutures or staples will be removed at the office during your follow-up visit. °8. ACTIVITIES:  You may resume regular (light) daily activities beginning the next day--such as daily self-care, walking, climbing stairs--gradually increasing activities as tolerated.  You may have sexual intercourse when it is comfortable.  Refrain from any heavy lifting or straining until approved by your doctor. °a. You may drive when you are no longer taking prescription pain medication, you can comfortably wear a seatbelt, and you can safely maneuver your car and apply brakes. °b. RETURN TO WORK:  __________________________________________________________ °9. You should see your doctor in the office for a follow-up appointment approximately 2-3 weeks after your surgery.  Make sure that you call for this appointment within a day or two after you  arrive home to insure a convenient appointment time. °10. OTHER INSTRUCTIONS: __________________________________________________________________________________________________________________________ __________________________________________________________________________________________________________________________ °WHEN TO CALL YOUR DOCTOR: °1. Fever over 101.0 °2. Inability to urinate °3. Continued bleeding from incision. °4. Increased pain, redness, or drainage from the incision. °5. Increasing abdominal pain ° °The clinic staff is available to answer your questions during regular business hours.  Please don’t hesitate to call and ask to speak to one of the nurses for clinical concerns.  If you have a medical emergency, go to the nearest emergency room or call 911.  A surgeon from [facility] is always on call at the hospital. °[address], [address], [place], [place]  [address] ? [address] [address], [place], [place]   [address] °[phone] ? [phone] ? FAX [phone] °Web site: [email or web address] ° °

## 2016-02-21 NOTE — Anesthesia Postprocedure Evaluation (Signed)
Anesthesia Post Note  Patient: Emily Mason  Procedure(s) Performed: Procedure(s) (LRB): LAPAROSCOPIC CHOLECYSTECTOMY WITH INTRAOPERATIVE CHOLANGIOGRAM (N/A)  Patient location during evaluation: PACU Anesthesia Type: General Level of consciousness: awake and alert Pain management: pain level controlled Vital Signs Assessment: post-procedure vital signs reviewed and stable Respiratory status: spontaneous breathing, nonlabored ventilation, respiratory function stable and patient connected to nasal cannula oxygen Cardiovascular status: blood pressure returned to baseline and stable Postop Assessment: no signs of nausea or vomiting Anesthetic complications: no    Last Vitals:  Filed Vitals:   02/21/16 1200 02/21/16 1215  BP: 141/92 132/84  Pulse: 51 53  Temp:    Resp: 14 12    Last Pain:  Filed Vitals:   02/21/16 1218  PainSc: 6                  Rosielee Corporan S

## 2016-02-21 NOTE — Anesthesia Preprocedure Evaluation (Signed)
Anesthesia Evaluation  Patient identified by MRN, date of birth, ID band Patient awake    Reviewed: Allergy & Precautions, NPO status , Patient's Chart, lab work & pertinent test results  Airway Mallampati: II  TM Distance: >3 FB Neck ROM: Full    Dental no notable dental hx.    Pulmonary neg pulmonary ROS,    Pulmonary exam normal breath sounds clear to auscultation       Cardiovascular negative cardio ROS Normal cardiovascular exam Rhythm:Regular Rate:Normal     Neuro/Psych negative neurological ROS  negative psych ROS   GI/Hepatic negative GI ROS, Neg liver ROS,   Endo/Other  negative endocrine ROS  Renal/GU negative Renal ROS  negative genitourinary   Musculoskeletal negative musculoskeletal ROS (+)   Abdominal   Peds negative pediatric ROS (+)  Hematology negative hematology ROS (+)   Anesthesia Other Findings   Reproductive/Obstetrics negative OB ROS                             Anesthesia Physical Anesthesia Plan  ASA: I  Anesthesia Plan: General   Post-op Pain Management:    Induction: Intravenous  Airway Management Planned: Oral ETT  Additional Equipment:   Intra-op Plan:   Post-operative Plan: Extubation in OR  Informed Consent: I have reviewed the patients History and Physical, chart, labs and discussed the procedure including the risks, benefits and alternatives for the proposed anesthesia with the patient or authorized representative who has indicated his/her understanding and acceptance.   Dental advisory given  Plan Discussed with: CRNA and Surgeon  Anesthesia Plan Comments:         Anesthesia Quick Evaluation  

## 2016-02-21 NOTE — Progress Notes (Signed)
Subjective: Stable and alert.  No nausea.  No fever.  Minimal discomfort. Seen by Dr. Myrtie Neitheranis of the GI service yesterday MRCP shows some gallbladder wall thickening.  Common bile duct not dilated.  Choledocholithiasis not seen. GI recommends no further workup and proceed with cholecystectomy, hepatitis is felt to be unlikely from their standpoint. Lab work this morning show transaminases coming down.  Bilirubin normal at 1.2.  WBC 5200.  Hemoglobin 11.2. Her husband is here and is in the room with her this morning.  Objective: Vital signs in last 24 hours: Temp:  [97.7 F (36.5 C)-98.3 F (36.8 C)] 97.7 F (36.5 C) (07/13 0243) Pulse Rate:  [56-76] 56 (07/13 0243) Resp:  [16-19] 16 (07/13 0243) BP: (96-125)/(46-80) 96/61 mmHg (07/13 0243) SpO2:  [100 %] 100 % (07/13 0243) Last BM Date: 02/19/16  Intake/Output from previous day: 07/12 0701 - 07/13 0700 In: 2163.8 [P.O.:360; I.V.:1803.8] Out: 2225 [Urine:2225] Intake/Output this shift: Total I/O In: 900 [I.V.:900] Out: 600 [Urine:600]  General appearance: Alert.  No distress.  Mental status normal.  Cooperative.  Good insight. Resp: clear to auscultation bilaterally GI: Soft.  Mild obesity.  Nondistended.  No objective tenderness.  No mass.  No scars.  No hernias.  Basically benign exam.  Lab Results:   Recent Labs  02/20/16 0436 02/21/16 0429  WBC 3.4* 5.2  HGB 11.5* 11.2*  HCT 34.8* 34.3*  PLT 215 223   BMET  Recent Labs  02/20/16 0436 02/21/16 0429  NA 137 138  K 3.5 4.0  CL 105 107  CO2 25 27  GLUCOSE 116* 114*  BUN 11 7  CREATININE 0.59 0.63  CALCIUM 8.5* 8.8*   PT/INR  Recent Labs  02/21/16 0429  LABPROT 14.1  INR 1.11   ABG No results for input(s): PHART, HCO3 in the last 72 hours.  Invalid input(s): PCO2, PO2  Studies/Results: Mr 3d Recon At Scanner  02/20/2016  CLINICAL DATA:  Right upper quadrant pain. Elevated liver function tests. Cholelithiasis and possible acute cholecystitis.  EXAM: MRI ABDOMEN WITHOUT AND WITH CONTRAST (INCLUDING MRCP) TECHNIQUE: Multiplanar multisequence MR imaging of the abdomen was performed both before and after the administration of intravenous contrast. Heavily T2-weighted images of the biliary and pancreatic ducts were obtained, and three-dimensional MRCP images were rendered by post processing. CONTRAST:  17mL MULTIHANCE GADOBENATE DIMEGLUMINE 529 MG/ML IV SOLN COMPARISON:  Abdomen ultrasound on 02/19/2016 FINDINGS: Lower chest:  No acute findings. Hepatobiliary: No liver masses are identified. No evidence of steatosis on chemical shift imaging. Small gallstones are seen. There is diffuse gallbladder wall edema with mild mucosal enhancement, suspicious for acute cholecystitis. No evidence of biliary ductal dilatation, with common bile duct measuring 3 mm. Pancreas: No mass, inflammatory changes, or other parenchymal abnormality identified. No evidence of pancreatic ductal dilatation. Spleen:  Within normal limits in size and appearance. Adrenals/Urinary Tract: No masses identified. Probable tiny sub-cm renal cysts noted bilaterally. No evidence of hydronephrosis. Stomach/Bowel: Visualized portions within the abdomen are unremarkable. Vascular/Lymphatic: No pathologically enlarged lymph nodes identified. No abdominal aortic aneurysm demonstrated. Other:  None. Musculoskeletal:  No suspicious bone lesions identified. IMPRESSION: Small gallstones, with diffuse gallbladder wall edema and mild mucosal enhancement, suspicious for acute cholecystitis. No radiographic evidence of biliary ductal dilatation or hepatic abnormality. Electronically Signed   By: Myles RosenthalJohn  Stahl M.D.   On: 02/20/2016 15:09   [place] Abdomen Limited  02/19/2016  CLINICAL DATA:  Right upper quadrant pain EXAM: US ABDOMEN LIMITED - RIGHT UPPER QUADRANT COMPARISON:  None.  FINDINGS: Gallbladder: Gallstones are noted within gallbladder the largest measures 5 mm. There is trace pericholecystic fluid. The  patient is tender when scanning gallbladder region. Early cholecystitis cannot be excluded. Clinical correlation is necessary. Common bile duct: Diameter: 3 mm in diameter within normal limits. Liver: No focal lesion identified. Within normal limits in parenchymal echogenicity. IMPRESSION: Gallstones are noted within gallbladder the largest measures 5 mm. There is trace pericholecystic fluid. The patient is tender when scanning gallbladder region. Early cholecystitis cannot be excluded. Clinical correlation is necessary. Normal CBD. Electronically Signed   By: Natasha Mead M.D.   On: 02/19/2016 14:04   Mr Jorja Loa Cm/mrcp  02/20/2016  CLINICAL DATA:  Right upper quadrant pain. Elevated liver function tests. Cholelithiasis and possible acute cholecystitis. EXAM: MRI ABDOMEN WITHOUT AND WITH CONTRAST (INCLUDING MRCP) TECHNIQUE: Multiplanar multisequence MR imaging of the abdomen was performed both before and after the administration of intravenous contrast. Heavily T2-weighted images of the biliary and pancreatic ducts were obtained, and three-dimensional MRCP images were rendered by post processing. CONTRAST:  17mL MULTIHANCE GADOBENATE DIMEGLUMINE 529 MG/ML IV SOLN COMPARISON:  Abdomen ultrasound on 02/19/2016 FINDINGS: Lower chest:  No acute findings. Hepatobiliary: No liver masses are identified. No evidence of steatosis on chemical shift imaging. Small gallstones are seen. There is diffuse gallbladder wall edema with mild mucosal enhancement, suspicious for acute cholecystitis. No evidence of biliary ductal dilatation, with common bile duct measuring 3 mm. Pancreas: No mass, inflammatory changes, or other parenchymal abnormality identified. No evidence of pancreatic ductal dilatation. Spleen:  Within normal limits in size and appearance. Adrenals/Urinary Tract: No masses identified. Probable tiny sub-cm renal cysts noted bilaterally. No evidence of hydronephrosis. Stomach/Bowel: Visualized portions within the  abdomen are unremarkable. Vascular/Lymphatic: No pathologically enlarged lymph nodes identified. No abdominal aortic aneurysm demonstrated. Other:  None. Musculoskeletal:  No suspicious bone lesions identified. IMPRESSION: Small gallstones, with diffuse gallbladder wall edema and mild mucosal enhancement, suspicious for acute cholecystitis. No radiographic evidence of biliary ductal dilatation or hepatic abnormality. Electronically Signed   By: Myles Rosenthal M.D.   On: 02/20/2016 15:09    Anti-infectives: Anti-infectives    Start     Dose/Rate Route Frequency Ordered Stop   02/19/16 2000  cefTRIAXone (ROCEPHIN) 2 g in dextrose 5 % 50 mL IVPB     2 g 100 mL/hr over 30 Minutes Intravenous Every 24 hours 02/19/16 1843        Assessment/Plan: s/p Procedure(s): ENDOSCOPIC RETROGRADE CHOLANGIOPANCREATOGRAPHY (ERCP)   Acute cholecystitis with cholelithiasis.  Minimally symptomatic now Abnormal liver function test.  Considering improvement, suspect that she possibly passed a CBD stone  I have offered and advised inpatient laparoscopic cholecystectomy with cholangiogram this admission.  In fact I offered to proceed with this surgery today. I discussed the indications, details, techniques, and numerous risk of laparoscopic cholecystectomy with cholangiogram, possible open cholecystectomy.  She is aware the risk of bleeding, infection, postop bile leak, wound hernia, injury to adjacent organs requiring major reconstructive surgery, conversion to open laparotomy, and other unforeseen problems.  She understands all these issues.  All of her questions are answered.  She wants to talk to her husband some more this morning before she makes her decision.  She understands all of this well.  She has a little anxiety because she is young and has never had surgery before.  This is understandable.  We will stand by and are prepared to proceed with cholecystectomy today.  She has been nothing by mouth after midnight.  Continue Rocephin.   LOS: 1 day    Ernestene Mention 02/21/2016

## 2016-02-21 NOTE — Transfer of Care (Signed)
Immediate Anesthesia Transfer of Care Note  Patient: Emily Mason  Procedure(s) Performed: Procedure(s): LAPAROSCOPIC CHOLECYSTECTOMY WITH INTRAOPERATIVE CHOLANGIOGRAM (N/A)  Patient Location: PACU  Anesthesia Type:General  Level of Consciousness: sedated  Airway & Oxygen Therapy: Patient Spontanous Breathing and Patient connected to face mask oxygen  Post-op Assessment: Report given to RN and Post -op Vital signs reviewed and stable  Post vital signs: Reviewed and stable  Last Vitals:  Filed Vitals:   02/21/16 0607 02/21/16 0909  BP: 121/78 132/78  Pulse: 59 72  Temp: 36.7 C 36.6 C  Resp: 18 18    Last Pain:  Filed Vitals:   02/21/16 0910  PainSc: Asleep      Patients Stated Pain Goal: 0 (02/19/16 1143)  Complications: No apparent anesthesia complications

## 2016-02-21 NOTE — Anesthesia Procedure Notes (Signed)
Procedure Name: Intubation Date/Time: 02/21/2016 9:57 AM Performed by: Doran ClayALDAY, Lakita Sahlin R Pre-anesthesia Checklist: Patient identified, Emergency Drugs available, Suction available, Patient being monitored and Timeout performed Patient Re-evaluated:Patient Re-evaluated prior to inductionOxygen Delivery Method: Circle system utilized Preoxygenation: Pre-oxygenation with 100% oxygen Intubation Type: IV induction Laryngoscope Size: Mac and 4 Grade View: Grade I Tube type: Oral Tube size: 7.0 mm Number of attempts: 1 Airway Equipment and Method: Stylet Placement Confirmation: ETT inserted through vocal cords under direct vision,  positive ETCO2 and breath sounds checked- equal and bilateral Secured at: 21 cm Tube secured with: Tape Dental Injury: Teeth and Oropharynx as per pre-operative assessment

## 2016-02-22 DIAGNOSIS — K8000 Calculus of gallbladder with acute cholecystitis without obstruction: Secondary | ICD-10-CM | POA: Diagnosis not present

## 2016-02-22 DIAGNOSIS — R7989 Other specified abnormal findings of blood chemistry: Secondary | ICD-10-CM | POA: Diagnosis not present

## 2016-02-22 DIAGNOSIS — R1011 Right upper quadrant pain: Secondary | ICD-10-CM | POA: Diagnosis not present

## 2016-02-22 LAB — CBC
HCT: 35.1 % — ABNORMAL LOW (ref 36.0–46.0)
Hemoglobin: 11.3 g/dL — ABNORMAL LOW (ref 12.0–15.0)
MCH: 22.7 pg — AB (ref 26.0–34.0)
MCHC: 32.2 g/dL (ref 30.0–36.0)
MCV: 70.6 fL — AB (ref 78.0–100.0)
PLATELETS: 215 10*3/uL (ref 150–400)
RBC: 4.97 MIL/uL (ref 3.87–5.11)
RDW: 15.9 % — AB (ref 11.5–15.5)
WBC: 9.8 10*3/uL (ref 4.0–10.5)

## 2016-02-22 LAB — COMPREHENSIVE METABOLIC PANEL
ALK PHOS: 202 U/L — AB (ref 38–126)
ALT: 692 U/L — AB (ref 14–54)
AST: 202 U/L — AB (ref 15–41)
Albumin: 3.7 g/dL (ref 3.5–5.0)
Anion gap: 6 (ref 5–15)
CALCIUM: 9 mg/dL (ref 8.9–10.3)
CHLORIDE: 104 mmol/L (ref 101–111)
CO2: 27 mmol/L (ref 22–32)
CREATININE: 0.65 mg/dL (ref 0.44–1.00)
Glucose, Bld: 107 mg/dL — ABNORMAL HIGH (ref 65–99)
Potassium: 4.8 mmol/L (ref 3.5–5.1)
Sodium: 137 mmol/L (ref 135–145)
Total Bilirubin: 1.1 mg/dL (ref 0.3–1.2)
Total Protein: 7.2 g/dL (ref 6.5–8.1)

## 2016-02-22 MED ORDER — HYDROCODONE-ACETAMINOPHEN 5-325 MG PO TABS: 1.0000 | ORAL_TABLET | ORAL | Status: DC | PRN

## 2016-02-22 MED ORDER — IBUPROFEN 200 MG PO TABS: ORAL_TABLET | ORAL | Status: DC

## 2016-02-22 NOTE — Progress Notes (Signed)
[facility] Progress Note  Chief Complaint: elevated LFts  Subjective History:  She is doing well this AM, with pain controlled on hydrocodone.  No fever, minimal nausea, no vomiting  ROS: Cardiovascular:  no chest pain Respiratory: no dyspnea  Objective:  Med list reviewed  Vital signs in last 24 hrs: Filed Vitals:   02/22/16 0124 02/22/16 0546  BP: 111/65 119/62  Pulse: 52 58  Temp: 98.2 F (36.8 C) 98.1 F (36.7 C)  Resp: 16 14    Physical Exam Eating breakfast  HEENT: sclera anicteric, oral mucosa moist without lesions  Neck: supple, no thyromegaly, JVD or lymphadenopathy  Cardiac: RRR without murmurs, S1S2 heard, no peripheral edema  Pulm: clear to auscultation bilaterally, normal RR and effort noted  Abdomen: soft, mild RUQ tenderness, with active bowel sounds. No guarding or palpable hepatosplenomegaly  Skin; warm and dry, no jaundice or rash  Recent Labs:   Recent Labs Lab 02/20/16 0436 02/21/16 0429 02/22/16 0434  WBC 3.4* 5.2 9.8  HGB 11.5* 11.2* 11.3*  HCT 34.8* 34.3* 35.1*  PLT 215 223 215    Recent Labs Lab 02/22/16 0434  NA 137  K 4.8  CL 104  CO2 27  BUN <5*  ALBUMIN 3.7  ALKPHOS 202*  ALT 692*  AST 202*  GLUCOSE 107*  LFTs improved  Recent Labs Lab 02/21/16 0429  INR 1.11      @ASSESSMENTPLANBEGIN @ Assessment and Plan:  Elevated LFTs RUQ pain Cholelithiasis with cholecystitis  Improved.  Dr Derrell LollingIngram and I agree that appears to have passed a bile duct stone.  No ERCP planned.  She is being discharged today for outpatient follow up with Dr Derrell LollingIngram and recheck of LFTs. I will be available if needs arise.   Charlie PitterHenry L Danis III Pager [phone] Mon-Fri 8a-5p [phone] after 5p, weekends, holidays

## 2016-02-22 NOTE — Progress Notes (Signed)
1 Day Post-Op  Subjective: Doing well.  Pain controlled.  Hungry.  Ambulating to bathroom.  Voiding without difficulty.  LFTs better.  AST 202.  ALT 692.  Alkaline phosphatase 202.  Total bilirubin 1.1.  WBC 9800.  Hemoglobin 11.3.  I explained the operative findings to her and her husband.  I told her that the cholangiogram did not drain well but there was no clear-cut filling defect.  Dr. Myrtie Neitheranis and I have discussed this and have agreed that if the liver function tests are improving we will continue to observe as an outpatient and would avoid the risk of an ERCP at this time.  She is perfectly comfortable with that.  She understands the implications of a retained CBD stone and that she could get symptoms in the future but it is less likely now that LFTs are going down.  I suspect that she may have passed a small CBD stone and now there is just a little bit of periampullary edema.  We are going to advance to soft diet this morning and possibly discharge today.  Follow-up with me in 3 weeks.  Check LFTs as outpatient.  Objective: Vital signs in last 24 hours: Temp:  [97.8 F (36.6 C)-98.6 F (37 C)] 98.1 F (36.7 C) (07/14 0546) Pulse Rate:  [44-72] 58 (07/14 0546) Resp:  [12-19] 14 (07/14 0546) BP: (110-141)/(62-92) 119/62 mmHg (07/14 0546) SpO2:  [100 %] 100 % (07/14 0546) Last BM Date: 02/19/16  Intake/Output from previous day: 07/13 0701 - 07/14 0700 In: 2288.3 [I.V.:2188.3; IV Piggyback:100] Out: 2920 [Urine:2900; Blood:20] Intake/Output this shift: Total I/O In: 938.3 [I.V.:888.3; IV Piggyback:50] Out: 1550 [Urine:1550]   EXAM: General appearance: Alert.  Pleasant.  Absolutely no distress.  Husband is present. GI: Abdomen basically soft.  Nondistended.  Almost no tenderness.  All wounds look fine.  Benign postop exam.  Lab Results:  Results for orders placed or performed during the hospital encounter of 02/19/16 (from the past 24 hour(s))  Comprehensive metabolic panel      Status: Abnormal   Collection Time: 02/22/16  4:34 AM  Result Value Ref Range   Sodium 137 135 - 145 mmol/L   Potassium 4.8 3.5 - 5.1 mmol/L   Chloride 104 101 - 111 mmol/L   CO2 27 22 - 32 mmol/L   Glucose, Bld 107 (H) 65 - 99 mg/dL   BUN <5 (L) 6 - 20 mg/dL   Creatinine, Ser 1.610.65 0.44 - 1.00 mg/dL   Calcium 9.0 8.9 - 09.610.3 mg/dL   Total Protein 7.2 6.5 - 8.1 g/dL   Albumin 3.7 3.5 - 5.0 g/dL   AST 045202 (H) 15 - 41 U/L   ALT 692 (H) 14 - 54 U/L   Alkaline Phosphatase 202 (H) 38 - 126 U/L   Total Bilirubin 1.1 0.3 - 1.2 mg/dL   GFR calc non Af Amer >60 >60 mL/min   GFR calc Af Amer >60 >60 mL/min   Anion gap 6 5 - 15  CBC     Status: Abnormal   Collection Time: 02/22/16  4:34 AM  Result Value Ref Range   WBC 9.8 4.0 - 10.5 K/uL   RBC 4.97 3.87 - 5.11 MIL/uL   Hemoglobin 11.3 (L) 12.0 - 15.0 g/dL   HCT 40.935.1 (L) 81.136.0 - 91.446.0 %   MCV 70.6 (L) 78.0 - 100.0 fL   MCH 22.7 (L) 26.0 - 34.0 pg   MCHC 32.2 30.0 - 36.0 g/dL   RDW 78.215.9 (H) 95.611.5 -  15.5 %   Platelets 215 150 - 400 K/uL     Studies/Results: Dg Cholangiogram Operative  02/21/2016  CLINICAL DATA:  Status post cholecystectomy. EXAM: INTRAOPERATIVE CHOLANGIOGRAM FLUOROSCOPY TIME:  56 seconds. TECHNIQUE: Cholangiographic images from the C-arm fluoroscopic device were submitted for interpretation post-operatively. Please see the procedural report for the amount of contrast and the fluoroscopy time utilized. COMPARISON:  MRI of February 20, 2016.  Ultrasound of February 19, 2016. FINDINGS: Three cine sequences were obtained and submitted for review. These images demonstrate contrast being injected through cannulated cystic duct remnant status post cholecystectomy. No intrahepatic or extrahepatic biliary dilatation is noted. However, rounded filling defect is noted in distal left common bile duct on final sequence concerning for retained bile duct stone. IMPRESSION: No biliary dilatation is noted on post cholecystectomy cholangiogram. However,  rounding filling defect is noted in distal left common bile duct concerning for retained common bile duct stone. Attempt was made to call report to given to OR extension, but no one answered. These results will be called to the ordering clinician or representative by the Radiologist Assistant, and communication documented in the PACS or zVision Dashboard. Electronically Signed   By: Lupita Raider, M.D.   On: 02/21/2016 11:47    . antiseptic oral rinse  7 mL Mouth Rinse q12n4p  . chlorhexidine  15 mL Mouth Rinse BID  . enoxaparin (LOVENOX) injection  40 mg Subcutaneous Q24H     Assessment/Plan: s/p Procedure(s): LAPAROSCOPIC CHOLECYSTECTOMY WITH INTRAOPERATIVE CHOLANGIOGRAM  POD #1.  Laparoscopic cholecystectomy with cholangiogram. Doing well Discharge home today if tolerates soft diet for breakfast Follow-up with me in 3 weeks and we will obtain complete metabolic panel at that time to make sure liver function tests are continuing to normalize  Abnormal intraoperative cholangiogram with poor emptying.  Suspect  periampullary edema following passage of a CBD stone.  No biliary ductal dilatation and no clear-cut filling defect.  LFTs continue to normalize.  In this setting risk of ERCP outweighs benefit.   @  LOS: 2 days    Orelia Brandstetter M 02/22/2016  . .prob

## 2016-02-22 NOTE — Progress Notes (Signed)
Discharge instructions discussed with patient and family, verbalized agreement and understanding, prescriptions given to patient by PA.

## 2016-02-22 NOTE — Discharge Summary (Signed)
Physician Discharge Summary  Patient ID: Emily Mason MRN: [id number] DOB/AGE: 28/12/1985 28 y.o.  Admit date: 02/19/2016 Discharge date: 02/22/2016  Admission Diagnoses:  Cholelithiasis/cholecystitis Elevated LFT'S Possible choledocholithiasis  Discharge Diagnoses:  Same  Active Problems:   Acute calculous cholecystitis   RUQ pain   Calculus of gallbladder with acute cholecystitis without obstruction   Cholecystitis, acute   Elevated LFTs   PROCEDURES: Laparoscopic cholecystectomy with IOC, 02/21/16  Hospital Course:  30 yo female who has had intermittent epigastric pain for the last 3 months. It generally lasted 1-2 hours and then resolved. This morning she woke from the pain and it continued throughout the day. Since treatment here it has gotten better but still present. She did have nausea and dry heaves yesterday. No fevers or chills, no constipation She was admitted in the PM of 02/19/16, [facility]  The following AM her LFT's were further elevated and GI consult was obtained.  She was seen by Dr. Myrtie Neitheranis and MRCP was obtained.  MRCP was completed on 02/20/16 and this showed gallstones, diffuse GB wall edema, no evidence of biliary ductal dilatation or hepatic abnormality. She was taken to the OR on 02/21/16 and underwent cholecystectomy and IOC. IOC:  findings showed normal intrahepatic and extrahepatic anatomy as described above, but poor emptying into the duodenum. The significance of this is unknown. Clear-cut filling defect was not seen following discussion with radiology. LFT's obtained post op day one showed continued improvement of her LFT'S.  It is Dr. Jacinto HalimIngram's opinion she has passed a stone prior to surgery & MRCP.  We plan to let her go home today, with follow up in 3 weeks with repeat LFT's.   Condition on D/C:  Improved  CBC Latest Ref Rng 02/22/2016 02/21/2016 02/20/2016  WBC 4.0 - 10.5 K/uL 9.8 5.2 3.4(L)  Hemoglobin 12.0 - 15.0 g/dL 11.3(L) 11.2(L) 11.5(L)  Hematocrit  36.0 - 46.0 % 35.1(L) 34.3(L) 34.8(L)  Platelets 150 - 400 K/uL 215 223 215   CMP Latest Ref Rng 02/22/2016 02/21/2016 02/20/2016  Glucose 65 - 99 mg/dL 409(W107(H) 119(J114(H) 478(G116(H)  BUN 6 - 20 mg/dL <9(F<5(L) 7 11  Creatinine 0.44 - 1.00 mg/dL 6.210.65 3.080.63 6.570.59  Sodium 135 - 145 mmol/L 137 138 137  Potassium 3.5 - 5.1 mmol/L 4.8 4.0 3.5  Chloride 101 - 111 mmol/L 104 107 105  CO2 22 - 32 mmol/L 27 27 25   Calcium 8.9 - 10.3 mg/dL 9.0 8.4(O8.8(L) 9.6(E8.5(L)  Total Protein 6.5 - 8.1 g/dL 7.2 7.2 7.5  Total Bilirubin 0.3 - 1.2 mg/dL 1.1 1.2 9.5(M2.7(H)  Alkaline Phos 38 - 126 U/L 202(H) 196(H) 191(H)  AST 15 - 41 U/L 202(H) 402(H) 1248(H)  ALT 14 - 54 U/L 692(H) 911(H) 1171(H)   First CMP  02/19/16 Sodium 135 - 145 mmol/L 136                    02/19/16   Potassium 3.5 - 5.1 mmol/L 4.6   Chloride 101 - 111 mmol/L 104   CO2 22 - 32 mmol/L 26   Glucose, Bld 65 - 99 mg/dL 841107 (H)   BUN 6 - 20 mg/dL 17   Creatinine, Ser 3.240.44 - 1.00 mg/dL 4.010.56   Calcium 8.9 - 02.710.3 mg/dL 9.1   Total Protein 6.5 - 8.1 g/dL 7.8   Albumin 3.5 - 5.0 g/dL 4.4   AST 15 - 41 U/L 253352 (H)   ALT 14 - 54 U/L 286 (H)   Alkaline Phosphatase 38 -  126 U/L 113   Total Bilirubin 0.3 - 1.2 mg/dL 1.7 (H)   GFR calc non Af Amer >60 mL/min >60   GFR calc Af Amer >60 mL/min >60   Comments: (NOTE)          Disposition: 01-Home or Self Care     Medication List    TAKE these medications        HYDROcodone-acetaminophen 5-325 MG tablet  Commonly known as:  NORCO/VICODIN  Take 1-2 tablets by mouth every 4 (four) hours as needed for moderate pain.     ibuprofen 200 MG tablet  Commonly known as:  ADVIL,MOTRIN  You can take 2-3 tablets every 6 hours as needed for pain safely.     polyethylene glycol packet  Commonly known as:  MIRALAX / GLYCOLAX  Take 17 g by mouth once.     prenatal multivitamin Tabs tablet  Take 1 tablet by mouth daily.       Follow-up Information    Follow up with Ernestene Mention, MD On 03/14/2016.    Specialty:  General Surgery   Why:  24- 48 hour before your appointment go and have your blood drawn for lab test.  Your appointment is at 10:30 AM, be at the office 30 minutes early for check in.     Contact information:   [address] [place] [address] [phone]       Signed: Sherrie George 02/22/2016, 8:02 AM

## 2016-07-22 ENCOUNTER — Ambulatory Visit (INDEPENDENT_AMBULATORY_CARE_PROVIDER_SITE_OTHER): Payer: BLUE CROSS/BLUE SHIELD | Admitting: Physician Assistant

## 2016-07-22 VITALS — BP 104/86 | HR 101 | Temp 98.5°F | Resp 16 | Ht 65.0 in | Wt 186.4 lb

## 2016-07-22 DIAGNOSIS — J069 Acute upper respiratory infection, unspecified: Secondary | ICD-10-CM | POA: Diagnosis not present

## 2016-07-22 DIAGNOSIS — Z9049 Acquired absence of other specified parts of digestive tract: Secondary | ICD-10-CM | POA: Insufficient documentation

## 2016-07-22 MED ORDER — HYDROCODONE-HOMATROPINE 5-1.5 MG/5ML PO SYRP: 5.0000 mL | ORAL_SOLUTION | Freq: Three times a day (TID) | ORAL | 0 refills | Status: DC | PRN

## 2016-07-22 MED ORDER — IPRATROPIUM BROMIDE 0.06 % NA SOLN: 2.0000 | Freq: Three times a day (TID) | NASAL | 0 refills | Status: DC

## 2016-07-22 MED ORDER — GUAIFENESIN ER 1200 MG PO TB12: 1.0000 | ORAL_TABLET | Freq: Two times a day (BID) | ORAL | 0 refills | Status: AC

## 2016-07-22 NOTE — Patient Instructions (Addendum)
Please push fluids.  Tylenol and Motrin for fever and body aches.    A humidifier can help especially when the air is dry -if you do not have a humidifier you can boil a pot of water on the stove in your home to help with the dry air.  Nasal saline spray to help with moisture in your nasal passageways

## 2016-07-22 NOTE — Progress Notes (Signed)
   Emily Mason  MRN: [id number] DOB: 06/07/1986  Subjective:  Pt presents to clinic with cold symptoms for the last 5 days - started with a cough with sinus congestion with some nasal discharge but she is also having a PND. Cough is keeping her up at night.    No flu vaccine Sick family members - husband and daughter Medications - alka-seltzer severe cold - no help   Review of Systems  Constitutional: Negative for chills and fever.  HENT: Positive for congestion, postnasal drip (yellow/green with blood) and rhinorrhea. Negative for sore throat.   Respiratory: Positive for cough. Negative for shortness of breath and wheezing.        No h/o asthma, nonsmoker  Gastrointestinal: Negative.   Musculoskeletal: Positive for myalgias.  Neurological: Negative for headaches.    Patient Active Problem List   Diagnosis Date Noted  . S/P cholecystectomy 07/22/2016    No current outpatient prescriptions on file prior to visit.   No current facility-administered medications on file prior to visit.     No Known Allergies  Pt patients past, family and social history were reviewed and updated.   Objective:  BP 104/86 (BP Location: Right Arm, Patient Position: Sitting, Cuff Size: Small)   Pulse (!) 101   Temp 98.5 F (36.9 C) (Oral)   Resp 16   Ht 5\' 5"  (1.651 m)   Wt 186 lb 6.4 oz (84.6 kg)   LMP 07/21/2016   SpO2 98%   Breastfeeding? No   BMI 31.02 kg/m   Physical Exam  Constitutional: She is oriented to person, place, and time and well-developed, well-nourished, and in no distress.  HENT:  Head: Normocephalic and atraumatic.  Right Ear: Hearing, tympanic membrane, external ear and ear canal normal.  Left Ear: Hearing, tympanic membrane, external ear and ear canal normal.  Nose: Mucosal edema (red and swollen) present.  Mouth/Throat: Uvula is midline, oropharynx is clear and moist and mucous membranes are normal.  Eyes: Conjunctivae are normal.  Neck: Normal range of motion.   Cardiovascular: Normal rate, regular rhythm and normal heart sounds.   No murmur heard. Pulmonary/Chest: Effort normal and breath sounds normal.  Neurological: She is alert and oriented to person, place, and time. Gait normal.  Skin: Skin is warm and dry.  Psychiatric: Mood, memory, affect and judgment normal.  Vitals reviewed.   Assessment and Plan :  URI with cough and congestion - Plan: Guaifenesin (MUCINEX MAXIMUM STRENGTH) 1200 MG TB12, ipratropium (ATROVENT) 0.06 % nasal spray, HYDROcodone-homatropine (HYCODAN) 5-1.5 MG/5ML syrup  symptomatic care - medications as above  Benny LennertSarah Majour Frei PA-C  [facility] 07/22/2016 10:24 AM

## 2016-12-09 ENCOUNTER — Encounter (HOSPITAL_COMMUNITY): Payer: Self-pay | Admitting: Emergency Medicine

## 2016-12-09 ENCOUNTER — Emergency Department (HOSPITAL_COMMUNITY)
Admission: EM | Admit: 2016-12-09 | Discharge: 2016-12-09 | Disposition: A | Discharge status: Home / Self Care | Payer: BLUE CROSS/BLUE SHIELD | Attending: Emergency Medicine | Admitting: Emergency Medicine

## 2016-12-09 DIAGNOSIS — Z79899 Other long term (current) drug therapy: Secondary | ICD-10-CM | POA: Insufficient documentation

## 2016-12-09 DIAGNOSIS — K529 Noninfective gastroenteritis and colitis, unspecified: Secondary | ICD-10-CM | POA: Insufficient documentation

## 2016-12-09 DIAGNOSIS — R112 Nausea with vomiting, unspecified: Secondary | ICD-10-CM | POA: Diagnosis present

## 2016-12-09 LAB — URINALYSIS, ROUTINE W REFLEX MICROSCOPIC
BILIRUBIN URINE: NEGATIVE
Bacteria, UA: NONE SEEN
GLUCOSE, UA: NEGATIVE mg/dL
Hgb urine dipstick: NEGATIVE
KETONES UR: NEGATIVE mg/dL
Leukocytes, UA: NEGATIVE
NITRITE: NEGATIVE
PH: 5 (ref 5.0–8.0)
Protein, ur: 100 mg/dL — AB
Specific Gravity, Urine: 1.026 (ref 1.005–1.030)

## 2016-12-09 LAB — PREGNANCY, URINE: Preg Test, Ur: NEGATIVE

## 2016-12-09 MED ORDER — SODIUM CHLORIDE 0.9 % IV BOLUS (SEPSIS)
1000.0000 mL | Freq: Once | INTRAVENOUS | Status: AC
  Administered 2016-12-09: 1000 mL via INTRAVENOUS

## 2016-12-09 MED ORDER — ONDANSETRON HCL 8 MG PO TABS: 8.0000 mg | ORAL_TABLET | Freq: Three times a day (TID) | ORAL | 0 refills | Status: DC | PRN

## 2016-12-09 MED ORDER — ONDANSETRON HCL 4 MG/2ML IJ SOLN
4.0000 mg | Freq: Once | INTRAMUSCULAR | Status: AC
  Administered 2016-12-09: 4 mg via INTRAVENOUS
  Filled 2016-12-09: qty 2

## 2016-12-09 NOTE — ED Notes (Signed)
Bed: WA08 Expected date:  Expected time:  Means of arrival:  Comments: EMS- syncope 

## 2016-12-09 NOTE — ED Triage Notes (Signed)
Per EMS pt was at [employer] [facility] and stated she felt like she was going to pass out, went and sat down then became very lethargic. Pt reports diarrhea and emesis onset 2am. Pt given 500CC NS en route and able to ambulate to restroom. CBG 131

## 2016-12-09 NOTE — Discharge Instructions (Signed)
Clear liquids this evening. Medication for nausea. Rest.

## 2016-12-10 NOTE — ED Provider Notes (Signed)
[facility]-EMERGENCY DEPT Provider Note   CSN: [id number] Arrival date & time: 12/09/16  1139     History   Chief Complaint Chief Complaint  Patient presents with  . Emesis    HPI Emily Mason is a 31 y.o. female.  Patient reports multiple episodes of nausea and vomiting since 2 AM this morning. She felt hot and cold. Her last menstrual period is presently. Past medical history includes a cholecystectomy. She felt lightheaded this afternoon at the [facility]. Glucose was within normal limits. She is normally healthy.      Past Medical History:  Diagnosis Date  . UTI (lower urinary tract infection)     Patient Active Problem List   Diagnosis Date Noted  . S/P cholecystectomy 07/22/2016    Past Surgical History:  Procedure Laterality Date  . CHOLECYSTECTOMY N/A 02/21/2016   Procedure: LAPAROSCOPIC CHOLECYSTECTOMY WITH INTRAOPERATIVE CHOLANGIOGRAM;  Surgeon: Claud Kelp, MD;  Location: [facility] ORS;  Service: General;  Laterality: N/A;  . WISDOM TOOTH EXTRACTION      OB History    Gravida Para Term Preterm AB Living   SAB TAB Ectopic Multiple Live Births   2     0 2       Home Medications    Prior to Admission medications   Medication Sig Start Date End Date Taking? Authorizing Provider  loratadine (CLARITIN) 10 MG tablet Take 10 mg by mouth daily.   Yes Historical Provider, MD  HYDROcodone-homatropine (HYCODAN) 5-1.5 MG/5ML syrup Take 5 mLs by mouth every 8 (eight) hours as needed for cough. Patient not taking: Reported on 12/09/2016 07/22/16   Morrell Riddle, PA-C  ipratropium (ATROVENT) 0.06 % nasal spray Place 2 sprays into the nose 3 (three) times daily. Patient not taking: Reported on 12/09/2016 07/22/16   Morrell Riddle, PA-C  ondansetron (ZOFRAN) 8 MG tablet Take 1 tablet (8 mg total) by mouth every 8 (eight) hours as needed for nausea or vomiting. 12/09/16   Donnetta Hutching, MD    Family History Family History  Problem Relation Age of Onset  .  Alcohol abuse Neg Hx   . Arthritis Neg Hx   . Asthma Neg Hx   . Birth defects Neg Hx   . Cancer Neg Hx   . COPD Neg Hx   . Depression Neg Hx   . Diabetes Neg Hx   . Drug abuse Neg Hx   . Early death Neg Hx   . Hearing loss Neg Hx   . Heart disease Neg Hx   . Hyperlipidemia Neg Hx   . Hypertension Neg Hx   . Kidney disease Neg Hx   . Learning disabilities Neg Hx   . Mental illness Neg Hx   . Mental retardation Neg Hx   . Miscarriages / Stillbirths Neg Hx   . Vision loss Neg Hx   . Stroke Neg Hx   . Varicose Veins Neg Hx     Social History Social History  Substance Use Topics  . Smoking status: Never Smoker  . Smokeless tobacco: Never Used  . Alcohol use No     Allergies   Food   Review of Systems Review of Systems  All other systems reviewed and are negative.    Physical Exam Updated Vital Signs BP 106/68 (BP Location: Left Arm)   Pulse 75   Temp 99 F (37.2 C) (Oral)   Resp 16   Ht  (1.626 m)  Wt 190 lb (86.2 kg)   LMP 11/09/2016   SpO2 100%   BMI 32.61 kg/m   Physical Exam  Constitutional: She is oriented to person, place, and time. She appears well-developed and well-nourished.  Alert, pleasant, dry mucous membranes.  HENT:  Head: Normocephalic and atraumatic.  Eyes: Conjunctivae are normal.  Neck: Neck supple.  Cardiovascular: Normal rate and regular rhythm.   Pulmonary/Chest: Effort normal and breath sounds normal.  Abdominal: Soft. Bowel sounds are normal.  Musculoskeletal: Normal range of motion.  Neurological: She is alert and oriented to person, place, and time.  Skin: Skin is warm and dry.  Psychiatric: She has a normal mood and affect. Her behavior is normal.  Nursing note and vitals reviewed.    ED Treatments / Results  Labs (all labs ordered are listed, but only abnormal results are displayed) Labs Reviewed  URINALYSIS, ROUTINE W REFLEX MICROSCOPIC - Abnormal; Notable for the following:       Result Value   APPearance  HAZY (*)    Protein, ur 100 (*)    Squamous Epithelial / LPF 0-5 (*)    All other components within normal limits  PREGNANCY, URINE    EKG  EKG Interpretation None       Radiology No results found.  Procedures Procedures (including critical care time)  Medications Ordered in ED Medications  ondansetron (ZOFRAN) injection 4 mg (4 mg Intravenous Given 12/09/16 1325)  sodium chloride 0.9 % bolus 1,000 mL (0 mLs Intravenous Stopped 12/09/16 1446)  sodium chloride 0.9 % bolus 1,000 mL (0 mLs Intravenous Stopped 12/09/16 1446)     Initial Impression / Assessment and Plan / ED Course  I have reviewed the triage vital signs and the nursing notes.  Pertinent labs & imaging results that were available during my care of the patient were reviewed by me and considered in my medical decision making (see chart for details).     Patient feels much better to 2 L of IV fluids and IV Zofran. I suspect this is a gastroenteritis picture. No evidence of an ectopic pregnancy.  Final Clinical Impressions(s) / ED Diagnoses   Final diagnoses:  Gastroenteritis    New Prescriptions Discharge Medication List as of 12/09/2016  2:40 PM    START taking these medications   Details  ondansetron (ZOFRAN) 8 MG tablet Take 1 tablet (8 mg total) by mouth every 8 (eight) hours as needed for nausea or vomiting., Starting Tue 12/09/2016, Print         Donnetta Hutching, MD 12/10/16 [phone]

## 2017-08-11 NOTE — L&D Delivery Note (Signed)
Delivery Note At 1532 a viable female was delivered via  (Presentation: vtx ;loa  ).  APGAR: 5,8 ; weight  .  pending Placenta status:circumvallant with trailing membranes , sent to Pathology.   Anesthesia:  epidural Episiotomy:  na Lacerations:  na Suture Repair:  Est. Blood Loss (mL):  400  Mom to postpartum.  Baby to Couplet care / Skin to Skin. NICU at delivery Lasonja Lakins A Kwanza Cancelliere CNM 11/02/2017, 3:53 PM    Milo No Circ

## 2017-11-02 ENCOUNTER — Inpatient Hospital Stay (HOSPITAL_COMMUNITY): Payer: BLUE CROSS/BLUE SHIELD | Admitting: Anesthesiology

## 2017-11-02 ENCOUNTER — Inpatient Hospital Stay (HOSPITAL_COMMUNITY)
Admission: AD | Admit: 2017-11-02 | Discharge: 2017-11-04 | DRG: 798 | Disposition: A | Discharge status: Home / Self Care | Payer: BLUE CROSS/BLUE SHIELD | Source: Ambulatory Visit | Attending: Obstetrics & Gynecology | Admitting: Obstetrics & Gynecology

## 2017-11-02 ENCOUNTER — Inpatient Hospital Stay (HOSPITAL_COMMUNITY): Payer: BLUE CROSS/BLUE SHIELD

## 2017-11-02 ENCOUNTER — Encounter (HOSPITAL_COMMUNITY)
Admission: AD | Disposition: A | Discharge status: Home / Self Care | Payer: Self-pay | Source: Ambulatory Visit | Attending: Obstetrics & Gynecology

## 2017-11-02 ENCOUNTER — Other Ambulatory Visit: Payer: Self-pay

## 2017-11-02 ENCOUNTER — Encounter (HOSPITAL_COMMUNITY): Payer: Self-pay | Admitting: *Deleted

## 2017-11-02 DIAGNOSIS — Z3A40 40 weeks gestation of pregnancy: Secondary | ICD-10-CM

## 2017-11-02 DIAGNOSIS — O99213 Obesity complicating pregnancy, third trimester: Secondary | ICD-10-CM

## 2017-11-02 DIAGNOSIS — O368130 Decreased fetal movements, third trimester, not applicable or unspecified: Secondary | ICD-10-CM | POA: Diagnosis present

## 2017-11-02 DIAGNOSIS — O26893 Other specified pregnancy related conditions, third trimester: Secondary | ICD-10-CM | POA: Diagnosis present

## 2017-11-02 DIAGNOSIS — O43119 Circumvallate placenta, unspecified trimester: Secondary | ICD-10-CM

## 2017-11-02 DIAGNOSIS — Z3689 Encounter for other specified antenatal screening: Secondary | ICD-10-CM

## 2017-11-02 DIAGNOSIS — Z6791 Unspecified blood type, Rh negative: Secondary | ICD-10-CM

## 2017-11-02 DIAGNOSIS — O480 Post-term pregnancy: Secondary | ICD-10-CM

## 2017-11-02 HISTORY — PX: DILATION AND CURETTAGE OF UTERUS: SHX78

## 2017-11-02 LAB — COMPREHENSIVE METABOLIC PANEL
ALT: 23 U/L (ref 14–54)
AST: 26 U/L (ref 15–41)
Albumin: 2.9 g/dL — ABNORMAL LOW (ref 3.5–5.0)
Alkaline Phosphatase: 158 U/L — ABNORMAL HIGH (ref 38–126)
Anion gap: 11 (ref 5–15)
BUN: 14 mg/dL (ref 6–20)
CO2: 19 mmol/L — ABNORMAL LOW (ref 22–32)
Calcium: 8.3 mg/dL — ABNORMAL LOW (ref 8.9–10.3)
Chloride: 104 mmol/L (ref 101–111)
Creatinine, Ser: 0.41 mg/dL — ABNORMAL LOW (ref 0.44–1.00)
GFR calc Af Amer: >60 mL/min (ref >60)
GFR calc non Af Amer: >60 mL/min (ref >60)
Glucose, Bld: 144 mg/dL — ABNORMAL HIGH (ref 65–99)
Potassium: 3.8 mmol/L (ref 3.5–5.1)
Sodium: 134 mmol/L — ABNORMAL LOW (ref 135–145)
Total Bilirubin: 0.5 mg/dL (ref 0.3–1.2)
Total Protein: 7 g/dL (ref 6.5–8.1)

## 2017-11-02 LAB — OB RESULTS CONSOLE HIV ANTIBODY (ROUTINE TESTING): HIV: NONREACTIVE

## 2017-11-02 LAB — CBC WITH DIFFERENTIAL/PLATELET
Basophils Absolute: 0 10*3/uL (ref 0.0–0.1)
Basophils Relative: 0 %
Eosinophils Absolute: 0.1 10*3/uL (ref 0.0–0.7)
Eosinophils Relative: 0 %
HCT: 36.3 % (ref 36.0–46.0)
Hemoglobin: 12.2 g/dL (ref 12.0–15.0)
Lymphocytes Relative: 8 %
Lymphs Abs: 1.7 10*3/uL (ref 0.7–4.0)
MCH: 24.5 pg — ABNORMAL LOW (ref 26.0–34.0)
MCHC: 33.6 g/dL (ref 30.0–36.0)
MCV: 72.9 fL — ABNORMAL LOW (ref 78.0–100.0)
Monocytes Absolute: 1 10*3/uL (ref 0.1–1.0)
Monocytes Relative: 5 %
Neutro Abs: 18.8 10*3/uL — ABNORMAL HIGH (ref 1.7–7.7)
Neutrophils Relative %: 87 %
Platelets: 162 10*3/uL (ref 150–400)
RBC: 4.98 MIL/uL (ref 3.87–5.11)
RDW: 16.2 % — ABNORMAL HIGH (ref 11.5–15.5)
WBC: 21.6 10*3/uL — ABNORMAL HIGH (ref 4.0–10.5)

## 2017-11-02 LAB — PROTEIN / CREATININE RATIO, URINE
Creatinine, Urine: 38 mg/dL
Protein Creatinine Ratio: 0.16 mg/mg{Cre} — ABNORMAL HIGH (ref 0.00–0.15)
Total Protein, Urine: 6 mg/dL

## 2017-11-02 LAB — OB RESULTS CONSOLE GC/CHLAMYDIA
Chlamydia: NEGATIVE
GC PROBE AMP, GENITAL: NEGATIVE

## 2017-11-02 LAB — OB RESULTS CONSOLE HEPATITIS B SURFACE ANTIGEN: Hepatitis B Surface Ag: NEGATIVE

## 2017-11-02 LAB — OB RESULTS CONSOLE ABO/RH: RH Type: NEGATIVE

## 2017-11-02 LAB — ABO/RH: ABO/RH(D): O POS

## 2017-11-02 LAB — OB RESULTS CONSOLE GBS: GBS: NEGATIVE

## 2017-11-02 LAB — OB RESULTS CONSOLE RUBELLA ANTIBODY, IGM: RUBELLA: IMMUNE

## 2017-11-02 LAB — OB RESULTS CONSOLE RPR: RPR: NONREACTIVE

## 2017-11-02 LAB — TYPE AND SCREEN
ABO/RH(D): O POS
Antibody Screen: NEGATIVE

## 2017-11-02 SURGERY — DILATION AND CURETTAGE
Anesthesia: General

## 2017-11-02 MED ORDER — LIDOCAINE HCL (PF) 1 % IJ SOLN: 30.0000 mL | INTRAMUSCULAR | Status: DC | PRN

## 2017-11-02 MED ORDER — OXYTOCIN 10 UNIT/ML IJ SOLN
INTRAVENOUS | Status: DC | PRN
  Administered 2017-11-02: 40 [IU] via INTRAVENOUS

## 2017-11-02 MED ORDER — SUCCINYLCHOLINE CHLORIDE 20 MG/ML IJ SOLN
INTRAMUSCULAR | Status: DC | PRN
  Administered 2017-11-02: 120 mg via INTRAVENOUS

## 2017-11-02 MED ORDER — FENTANYL CITRATE (PF) 100 MCG/2ML IJ SOLN: 50.0000 ug | INTRAMUSCULAR | Status: DC | PRN

## 2017-11-02 MED ORDER — TERBUTALINE SULFATE 1 MG/ML IJ SOLN: 0.2500 mg | Freq: Once | INTRAMUSCULAR | Status: DC | PRN

## 2017-11-02 MED ORDER — DEXAMETHASONE SODIUM PHOSPHATE 10 MG/ML IJ SOLN
INTRAMUSCULAR | Status: AC
  Filled 2017-11-02: qty 1

## 2017-11-02 MED ORDER — KETOROLAC TROMETHAMINE 30 MG/ML IJ SOLN
INTRAMUSCULAR | Status: AC
  Filled 2017-11-02: qty 2

## 2017-11-02 MED ORDER — LACTATED RINGERS IV SOLN
INTRAVENOUS | Status: DC
  Administered 2017-11-02 (×3): via INTRAVENOUS

## 2017-11-02 MED ORDER — HYDROMORPHONE HCL 1 MG/ML IJ SOLN
INTRAMUSCULAR | Status: DC | PRN
  Administered 2017-11-02: 1 mg via INTRAVENOUS

## 2017-11-02 MED ORDER — FENTANYL CITRATE (PF) 100 MCG/2ML IJ SOLN
INTRAMUSCULAR | Status: AC
  Filled 2017-11-02: qty 2

## 2017-11-02 MED ORDER — SOD CITRATE-CITRIC ACID 500-334 MG/5ML PO SOLN: 30.0000 mL | ORAL | Status: DC | PRN

## 2017-11-02 MED ORDER — OXYCODONE-ACETAMINOPHEN 5-325 MG PO TABS: 2.0000 | ORAL_TABLET | ORAL | Status: DC | PRN

## 2017-11-02 MED ORDER — PROPOFOL 10 MG/ML IV BOLUS
INTRAVENOUS | Status: DC | PRN
  Administered 2017-11-02: 200 mg via INTRAVENOUS

## 2017-11-02 MED ORDER — OXYTOCIN 10 UNIT/ML IJ SOLN
INTRAMUSCULAR | Status: AC
Start: 2017-11-02 — End: ?
  Filled 2017-11-02: qty 1

## 2017-11-02 MED ORDER — FENTANYL 2.5 MCG/ML BUPIVACAINE 1/10 % EPIDURAL INFUSION (WH - ANES)
14.0000 mL/h | INTRAMUSCULAR | Status: DC | PRN
  Administered 2017-11-02: 14 mL/h via EPIDURAL
  Filled 2017-11-02: qty 100

## 2017-11-02 MED ORDER — MIDAZOLAM HCL 5 MG/5ML IJ SOLN
INTRAMUSCULAR | Status: DC | PRN
  Administered 2017-11-02: 2 mg via INTRAVENOUS

## 2017-11-02 MED ORDER — ONDANSETRON HCL 4 MG/2ML IJ SOLN
INTRAMUSCULAR | Status: DC | PRN
  Administered 2017-11-02: 4 mg via INTRAVENOUS

## 2017-11-02 MED ORDER — OXYTOCIN 10 UNIT/ML IJ SOLN
INTRAMUSCULAR | Status: AC
  Filled 2017-11-02: qty 4

## 2017-11-02 MED ORDER — METHYLERGONOVINE MALEATE 0.2 MG PO TABS: 0.2000 mg | ORAL_TABLET | ORAL | Status: DC | PRN

## 2017-11-02 MED ORDER — OXYTOCIN BOLUS FROM INFUSION: 500.0000 mL | Freq: Once | INTRAVENOUS | Status: DC

## 2017-11-02 MED ORDER — METHYLERGONOVINE MALEATE 0.2 MG/ML IJ SOLN
INTRAMUSCULAR | Status: AC
  Filled 2017-11-02: qty 1

## 2017-11-02 MED ORDER — EPHEDRINE 5 MG/ML INJ: 10.0000 mg | INTRAVENOUS | Status: DC | PRN

## 2017-11-02 MED ORDER — KETOROLAC TROMETHAMINE 30 MG/ML IJ SOLN
INTRAMUSCULAR | Status: DC | PRN
  Administered 2017-11-02: 30 mg via INTRAVENOUS

## 2017-11-02 MED ORDER — LIDOCAINE HCL (CARDIAC) 20 MG/ML IV SOLN
INTRAVENOUS | Status: AC
  Filled 2017-11-02: qty 5

## 2017-11-02 MED ORDER — SODIUM CHLORIDE 0.9 % IV SOLN
3.0000 g | Freq: Once | INTRAVENOUS | Status: AC
  Administered 2017-11-02: 3 g via INTRAVENOUS
  Filled 2017-11-02: qty 3

## 2017-11-02 MED ORDER — ACETAMINOPHEN 325 MG PO TABS: 650.0000 mg | ORAL_TABLET | ORAL | Status: DC | PRN

## 2017-11-02 MED ORDER — ONDANSETRON HCL 4 MG/2ML IJ SOLN
INTRAMUSCULAR | Status: AC
  Filled 2017-11-02: qty 2

## 2017-11-02 MED ORDER — LIDOCAINE HCL (PF) 1 % IJ SOLN
INTRAMUSCULAR | Status: DC | PRN
  Administered 2017-11-02 (×2): 4 mL via EPIDURAL

## 2017-11-02 MED ORDER — LIDOCAINE HCL 1 % IJ SOLN
INTRAMUSCULAR | Status: AC
  Filled 2017-11-02: qty 20

## 2017-11-02 MED ORDER — SUCCINYLCHOLINE CHLORIDE 200 MG/10ML IV SOSY
PREFILLED_SYRINGE | INTRAVENOUS | Status: AC
  Filled 2017-11-02: qty 10

## 2017-11-02 MED ORDER — PHENYLEPHRINE 40 MCG/ML (10ML) SYRINGE FOR IV PUSH (FOR BLOOD PRESSURE SUPPORT)
80.0000 ug | PREFILLED_SYRINGE | INTRAVENOUS | Status: DC | PRN
  Filled 2017-11-02: qty 10

## 2017-11-02 MED ORDER — LACTATED RINGERS IV SOLN: INTRAVENOUS | Status: DC

## 2017-11-02 MED ORDER — LACTATED RINGERS IV SOLN
500.0000 mL | Freq: Once | INTRAVENOUS | Status: AC
  Administered 2017-11-02: 500 mL via INTRAVENOUS

## 2017-11-02 MED ORDER — PROMETHAZINE HCL 25 MG/ML IJ SOLN: 6.2500 mg | INTRAMUSCULAR | Status: DC | PRN

## 2017-11-02 MED ORDER — LIDOCAINE HCL (CARDIAC) 20 MG/ML IV SOLN
INTRAVENOUS | Status: DC | PRN
  Administered 2017-11-02: 100 mg via INTRAVENOUS

## 2017-11-02 MED ORDER — ONDANSETRON HCL 4 MG/2ML IJ SOLN
4.0000 mg | Freq: Four times a day (QID) | INTRAMUSCULAR | Status: DC | PRN
Start: 2017-11-02 — End: 2017-11-03

## 2017-11-02 MED ORDER — OXYCODONE-ACETAMINOPHEN 5-325 MG PO TABS: 1.0000 | ORAL_TABLET | ORAL | Status: DC | PRN

## 2017-11-02 MED ORDER — DIPHENHYDRAMINE HCL 50 MG/ML IJ SOLN: 12.5000 mg | INTRAMUSCULAR | Status: DC | PRN

## 2017-11-02 MED ORDER — LIDOCAINE HCL 1 % IJ SOLN
INTRAMUSCULAR | Status: DC | PRN
  Administered 2017-11-02: 20 mL

## 2017-11-02 MED ORDER — OXYTOCIN 40 UNITS IN LACTATED RINGERS INFUSION - SIMPLE MED
1.0000 m[IU]/min | INTRAVENOUS | Status: DC
  Administered 2017-11-02: 2 m[IU]/min via INTRAVENOUS
  Administered 2017-11-02: 1 m[IU]/min via INTRAVENOUS
  Administered 2017-11-02: 4 m[IU]/min via INTRAVENOUS
  Administered 2017-11-02: 6 m[IU]/min via INTRAVENOUS

## 2017-11-02 MED ORDER — DEXTROSE 5 % IN LACTATED RINGERS IV BOLUS
500.0000 mL | Freq: Once | INTRAVENOUS | Status: AC
  Administered 2017-11-02: 500 mL via INTRAVENOUS

## 2017-11-02 MED ORDER — PHENYLEPHRINE 40 MCG/ML (10ML) SYRINGE FOR IV PUSH (FOR BLOOD PRESSURE SUPPORT): 80.0000 ug | PREFILLED_SYRINGE | INTRAVENOUS | Status: DC | PRN

## 2017-11-02 MED ORDER — MEPERIDINE HCL 25 MG/ML IJ SOLN: 6.2500 mg | INTRAMUSCULAR | Status: DC | PRN

## 2017-11-02 MED ORDER — LACTATED RINGERS IV SOLN
500.0000 mL | INTRAVENOUS | Status: DC | PRN
  Administered 2017-11-02: 500 mL via INTRAVENOUS

## 2017-11-02 MED ORDER — HYDROMORPHONE HCL 1 MG/ML IJ SOLN: 0.2500 mg | INTRAMUSCULAR | Status: DC | PRN

## 2017-11-02 MED ORDER — OXYTOCIN 40 UNITS IN LACTATED RINGERS INFUSION - SIMPLE MED
2.5000 [IU]/h | INTRAVENOUS | Status: DC
  Administered 2017-11-02: 2.5 [IU]/h via INTRAVENOUS
  Filled 2017-11-02: qty 1000

## 2017-11-02 MED ORDER — METHYLERGONOVINE MALEATE 0.2 MG/ML IJ SOLN
0.2000 mg | INTRAMUSCULAR | Status: DC | PRN
  Administered 2017-11-02: 0.2 mg via INTRAMUSCULAR

## 2017-11-02 MED ORDER — SCOPOLAMINE 1 MG/3DAYS TD PT72
MEDICATED_PATCH | TRANSDERMAL | Status: AC
  Filled 2017-11-02: qty 2

## 2017-11-02 MED ORDER — MIDAZOLAM HCL 2 MG/2ML IJ SOLN
INTRAMUSCULAR | Status: AC
  Filled 2017-11-02: qty 2

## 2017-11-02 MED ORDER — HYDROMORPHONE HCL 1 MG/ML IJ SOLN
INTRAMUSCULAR | Status: AC
  Filled 2017-11-02: qty 1

## 2017-11-02 MED ORDER — LACTATED RINGERS IV SOLN: 500.0000 mL | Freq: Once | INTRAVENOUS | Status: DC

## 2017-11-02 MED ORDER — DEXAMETHASONE SODIUM PHOSPHATE 10 MG/ML IJ SOLN
INTRAMUSCULAR | Status: DC | PRN
  Administered 2017-11-02: 10 mg via INTRAVENOUS

## 2017-11-02 MED ORDER — FENTANYL CITRATE (PF) 100 MCG/2ML IJ SOLN
INTRAMUSCULAR | Status: DC | PRN
  Administered 2017-11-02: 100 ug via INTRAVENOUS

## 2017-11-02 SURGICAL SUPPLY — 10 items
CATH ROBINSON RED A/P 16FR (CATHETERS) ×2 IMPLANT
CLOTH BEACON ORANGE TIMEOUT ST (SAFETY) ×2 IMPLANT
GLOVE BIO SURGEON STRL SZ 6.5 (GLOVE) ×2 IMPLANT
GLOVE BIOGEL PI IND STRL 7.0 (GLOVE) ×2 IMPLANT
GLOVE BIOGEL PI INDICATOR 7.0 (GLOVE) ×2
GOWN STRL REUS W/TWL LRG LVL3 (GOWN DISPOSABLE) ×4 IMPLANT
PACK VAGINAL MINOR WOMEN LF (CUSTOM PROCEDURE TRAY) ×2 IMPLANT
PAD OB MATERNITY 4.3X12.25 (PERSONAL CARE ITEMS) ×2 IMPLANT
TOWEL OR 17X24 6PK STRL BLUE (TOWEL DISPOSABLE) ×4 IMPLANT
WATER STERILE IRR 1000ML POUR (IV SOLUTION) ×2 IMPLANT

## 2017-11-02 NOTE — MAU Note (Signed)
[facility].  Pt. Here for SROM, minium variablity, FHR 170s, mec. Stain fluid. [facility]. IV started with 18g, LR infusing.

## 2017-11-02 NOTE — Anesthesia Procedure Notes (Signed)
Procedure Name: Intubation Date/Time: 11/02/2017 7:39 PM Performed by: Junious SilkGilbert, Torrion Witter, CRNA Pre-anesthesia Checklist: Patient identified, Emergency Drugs available, Suction available, Patient being monitored and Timeout performed Patient Re-evaluated:Patient Re-evaluated prior to induction Oxygen Delivery Method: Circle system utilized Preoxygenation: Pre-oxygenation with 100% oxygen Induction Type: IV induction, Rapid sequence and Cricoid Pressure applied Laryngoscope Size: Miller and 2 Grade View: Grade I Tube type: Oral Tube size: 7.0 mm Number of attempts: 1 Airway Equipment and Method: Stylet Placement Confirmation: ETT inserted through vocal cords under direct vision,  positive ETCO2,  CO2 detector and breath sounds checked- equal and bilateral Secured at: 22 cm Tube secured with: Tape Dental Injury: Teeth and Oropharynx as per pre-operative assessment

## 2017-11-02 NOTE — Brief Op Note (Signed)
11/02/2017  8:21 PM  PATIENT:  Emily Mason  32 y.o. female  PRE-OPERATIVE DIAGNOSIS:  Dilation and curettage for retained products  POST-OPERATIVE DIAGNOSIS:  Dilation and curettage for retained products  PROCEDURE:  Procedure(s): DILATATION AND CURETTAGE (N/A)  SURGEON:  Surgeon(s) and Role:    * Essie HartPinn, Chlora Mcbain, MD - Primary  PHYSICIAN ASSISTANT:   ASSISTANTS: none   ANESTHESIA:   general LMA  EBL:  1000 mL   BLOOD ADMINISTERED:none  DRAINS: none   LOCAL MEDICATIONS USED:  LIDOCAINE  and Amount: 10 ml  SPECIMEN:  Source of Specimen:  POC  DISPOSITION OF SPECIMEN:  PATHOLOGY  COUNTS:  YES  TOURNIQUET:  * No tourniquets in log *  DICTATION: .Note written in EPIC  PLAN OF CARE: Admit to inpatient   PATIENT DISPOSITION:  PACU - hemodynamically stable.   Delay start of Pharmacological VTE agent (>24hrs) due to surgical blood loss or risk of bleeding: not applicable  Corinda Ammon [name]

## 2017-11-02 NOTE — Transfer of Care (Signed)
Immediate Anesthesia Transfer of Care Note  Patient: Emily Mason  Procedure(s) Performed: DILATATION AND CURETTAGE (N/A )  Patient Location: PACU  Anesthesia Type:General  Level of Consciousness: awake, alert  and oriented  Airway & Oxygen Therapy: Patient Spontanous Breathing and Patient connected to nasal cannula oxygen  Post-op Assessment: Report given to RN and Post -op Vital signs reviewed and stable  Post vital signs: Reviewed and stable  Last Vitals:  Vitals Value Taken Time  BP 129/90 11/02/2017  8:26 PM  Temp    Pulse 101 11/02/2017  8:28 PM  Resp 21 11/02/2017  8:28 PM  SpO2 100 % 11/02/2017  8:28 PM  Vitals shown include unvalidated device data.  Last Pain:  Vitals:   11/02/17 1900  TempSrc:   PainSc: 0-No pain         Complications: No apparent anesthesia complications

## 2017-11-02 NOTE — H&P (Signed)
Emily Mason is a 32 y.o. female presenting for decreased fetal movement and SROM @ approximately 1000 am.. OB History    Gravida  4   Para  2   Term  2   Preterm      AB  2   Living  2     SAB  2   TAB      Ectopic      Multiple  0   Live Births  2          Past Medical History:  Diagnosis Date  . UTI (lower urinary tract infection)    Past Surgical History:  Procedure Laterality Date  . CHOLECYSTECTOMY N/A 02/21/2016   Procedure: LAPAROSCOPIC CHOLECYSTECTOMY WITH INTRAOPERATIVE CHOLANGIOGRAM;  Surgeon: Claud Kelp, MD;  Location: [facility];  Service: General;  Laterality: N/A;  . WISDOM TOOTH EXTRACTION     Family History: family history is not on file. Social History:  reports that she has never smoked. She has never used smokeless tobacco. She reports that she does not drink alcohol or use drugs.     Maternal Diabetes: No Genetic Screening: WNL Maternal Ultrasounds/Referrals: none Fetal Ultrasounds or other Referrals:  none Maternal Substance Abuse:  No Significant Maternal Medications:  None Significant Maternal Lab Results:  None Other Comments:   Review of Systems  Gastrointestinal: Positive for abdominal pain.  All other systems reviewed and are negative.  Maternal Medical History:  Reason for admission: Rupture of membranes.   Contractions: Onset was less than 1 hour ago.   Frequency: irregular.   Perceived severity is moderate.    Fetal activity: Perceived fetal activity is decreased.   Last perceived fetal movement was within the past 12 hours.    Prenatal complications: Circumvallate palcenta  Prenatal Complications - Diabetes: none.   BPP_8/8 Cat 2 strip uc's occasional  Dilation: 5.5 Effacement (%): Thick Station: -3 Exam by:: Clemmons, CNM Blood pressure 131/89, pulse (!) 106, temperature 98.6 F (37 C), temperature source Oral, resp. rate 20, height 5\' 4"  (1.626 m), weight 224 lb 4 oz (101.7 kg), SpO2 98 %. Maternal  Exam:  Uterine Assessment: Contraction strength is moderate.  Contraction frequency is irregular.   Abdomen: Patient reports no abdominal tenderness. Estimated fetal weight is 8.   Fetal presentation: vertex  Introitus: Normal vulva. Normal vagina.  Amniotic fluid character: meconium stained.  Pelvis: adequate for delivery.   Cervix: Cervix evaluated by digital exam.     Fetal Exam Fetal Monitor Review: Mode: ultrasound.   Variability: minimal (<5 bpm).   Pattern: no decelerations and no accelerations.    Fetal State Assessment: Category II - tracings are indeterminate.     Physical Exam  Nursing note and vitals reviewed. Constitutional: She is oriented to person, place, and time. She appears well-developed and well-nourished.  HENT:  Head: Normocephalic and atraumatic.  Neck: Normal range of motion.  Cardiovascular: Normal rate and regular rhythm.  Respiratory: Effort normal. No respiratory distress.  GI: Soft. She exhibits no distension.  Genitourinary: Vagina normal.  Musculoskeletal: Normal range of motion.  Neurological: She is alert and oriented to person, place, and time.  Skin: Skin is warm and dry.  Psychiatric: She has a normal mood and affect. Her behavior is normal. Judgment and thought content normal.    Prenatal labs: ABO, Rh:  O Negative Antibody:  Negative Rubella:  immune RPR:   NR HBsAg:   neg HIV:   neg GBS:   Negative  Assessment/Plan: Admit to L/D  Meconium Stained fluid- Green GBS Negative RH Negative Augment with Pitocin Routine orders Anticipate vaginal delivery   Lawson FiscalLori A Clemmons CNM 11/02/2017, 11:56 AM

## 2017-11-02 NOTE — MAU Note (Signed)
Pt. Here for SROM at 1001, dark green mec. Fluid on peripad, large amount of fluid. Positive for fetal movement.  EFM applied - FHR 170, Toco applied - abd. soft

## 2017-11-02 NOTE — Progress Notes (Signed)
Discussed POC with Dr Mora ApplPinn. Pt will be admitted to L/D

## 2017-11-02 NOTE — Anesthesia Preprocedure Evaluation (Signed)
Anesthesia Evaluation  Patient identified by MRN, date of birth, ID band Patient awake    Reviewed: Allergy & Precautions, Patient's Chart, lab work & pertinent test results  Airway Mallampati: II  TM Distance: >3 FB Neck ROM: Full    Dental no notable dental hx. (+) Teeth Intact   Pulmonary neg pulmonary ROS,    Pulmonary exam normal breath sounds clear to auscultation       Cardiovascular negative cardio ROS Normal cardiovascular exam Rhythm:Regular Rate:Normal     Neuro/Psych negative psych ROS   GI/Hepatic negative GI ROS, Neg liver ROS,   Endo/Other  negative endocrine ROS  Renal/GU negative Renal ROS  negative genitourinary   Musculoskeletal negative musculoskeletal ROS (+)   Abdominal (+) + obese,   Peds  Hematology negative hematology ROS (+)   Anesthesia Other Findings   Reproductive/Obstetrics (+) Pregnancy                             Anesthesia Physical Anesthesia Plan  ASA: II  Anesthesia Plan: Epidural   Post-op Pain Management:    Induction:   PONV Risk Score and Plan:   Airway Management Planned: Natural Airway  Additional Equipment:   Intra-op Plan:   Post-operative Plan:   Informed Consent: I have reviewed the patients History and Physical, chart, labs and discussed the procedure including the risks, benefits and alternatives for the proposed anesthesia with the patient or authorized representative who has indicated his/her understanding and acceptance.     Plan Discussed with: Anesthesiologist  Anesthesia Plan Comments:         Anesthesia Quick Evaluation

## 2017-11-02 NOTE — Anesthesia Preprocedure Evaluation (Signed)
Anesthesia Evaluation  Patient identified by MRN, date of birth, ID band Patient awake    Reviewed: Allergy & Precautions, NPO status , Patient's Chart, lab work & pertinent test results  Airway Mallampati: II  TM Distance: >3 FB Neck ROM: Full    Dental  (+) Teeth Intact, Dental Advisory Given   Pulmonary neg pulmonary ROS,    breath sounds clear to auscultation       Cardiovascular negative cardio ROS   Rhythm:Regular Rate:Normal     Neuro/Psych negative neurological ROS  negative psych ROS   GI/Hepatic negative GI ROS, Neg liver ROS,   Endo/Other  negative endocrine ROS  Renal/GU negative Renal ROS  negative genitourinary   Musculoskeletal negative musculoskeletal ROS (+)   Abdominal   Peds  Hematology negative hematology ROS (+)   Anesthesia Other Findings   Reproductive/Obstetrics negative OB ROS                             Anesthesia Physical Anesthesia Plan  ASA: II  Anesthesia Plan: General   Post-op Pain Management:    Induction: Intravenous, Rapid sequence and Cricoid pressure planned  PONV Risk Score and Plan: 4 or greater and Ondansetron, Dexamethasone and Midazolam  Airway Management Planned: Oral ETT  Additional Equipment: None  Intra-op Plan:   Post-operative Plan: Extubation in OR  Informed Consent: I have reviewed the patients History and Physical, chart, labs and discussed the procedure including the risks, benefits and alternatives for the proposed anesthesia with the patient or authorized representative who has indicated his/her understanding and acceptance.   Dental advisory given  Plan Discussed with: CRNA  Anesthesia Plan Comments:         Anesthesia Quick Evaluation

## 2017-11-02 NOTE — MAU Note (Signed)
BS U/S complete, pt. Transferred to L&D for delivery, [facility]  [facility]

## 2017-11-02 NOTE — Progress Notes (Signed)
Category 2 strip. Late decel and early decels

## 2017-11-02 NOTE — Progress Notes (Signed)
Emily Mason is a 32 y.o. [id number] at 7066w0d by  admitted for rupture of membranes, non reassurring FHT's  Subjective:   Objective: BP 113/71   Pulse 100   Temp 98.4 F (36.9 C) (Axillary)   Resp 20   Ht 5\' 4"  (1.626 m)   Wt 224 lb (101.6 kg)   LMP 11/09/2016   SpO2 100%   BMI 38.45 kg/m  No intake/output data recorded. No intake/output data recorded.  FHT:  Cat 2 UC:   regular, every 1 minutes SVE:   Dilation: 6.5 Effacement (%): 70 Station: -3 Exam by:: Dr. Mora ApplPinn  Labs: Lab Results  Component Value Date   WBC 21.6 (H) 11/02/2017   HGB 12.2 11/02/2017   HCT 36.3 11/02/2017   MCV 72.9 (L) 11/02/2017   PLT 162 11/02/2017    Assessment / Plan: Augmentation of labor, progressing well  Labor: Progressing normal Preeclampsia:  labs stable Fetal Wellbeing:  Category II Pain Control:  Epidural I/D:  n/a Anticipated MOD:  NSVD  Lori A Clemmons CNM 11/02/2017, 2:55 PM

## 2017-11-02 NOTE — Op Note (Signed)
Operative Report  Audrianna Laqueta DueY Peretti female 32 y.o. November 02, 2017  Procedure:    * DILATATION AND EVACUATION - Choice  Preoperative Diagnosis:  Retained membranes / POC after vaginal delivery  Post operative Diagnosis:  Same plus post partum hemorrhage      Surgeon: Essie HartWalda Sahily Biddle   Assistants: none  Anesthesia: General LMA anesthesia and Local anesthesia 1% buffered lidocaine  ASA Class: 2  Procedure Detail  DILATATION AND EVACUATION  Findings: 20 week size uterus to 16 size post procedure. Moderate amount of retained POC.  Good crie was achieved.  Estimated Blood Loss:  1000 mL         Drains: straight cath prior to procedure         Total IV Fluids: 1200 ml  Blood Given: none          Specimens: POC         Implants: none        Complications:  * No complications entered in OR log *         Technique:   Patient was placed in dorsal lithotomy position in [employer]. After adequate anesthesia was achieved, the patient was prepped and draped in the usual sterile fashion. The operative Graves speculum was placed in the vagina and the cervix stabilized with a ring forceps.  A paracervical block using 1% lidocaine was performed. The cervix was already dilated and a 16 mm curette was used to remove contents of the uterus.  Alternating sharp curettage with a Banjo curette and suction curettage was performed until all contents were removed and good crie was achieved.  Uterine massage and IV pitocin done to help with hemostasis.   All instruments were removed from the vagina.  The patient tolerated the procedure well.    Disposition: PACU - hemodynamically stable.         Condition: stable  Shianne Zeiser [name]

## 2017-11-02 NOTE — Progress Notes (Signed)
Cat 2 strip. Decreased variability with occ variable decel. Dr Mora ApplPinn aware and reviewed strip

## 2017-11-02 NOTE — Anesthesia Procedure Notes (Signed)
Epidural Patient location during procedure: OB Start time: 11/02/2017 1:17 PM  Staffing Anesthesiologist: Mal AmabileFoster, Fraser Busche, MD Performed: anesthesiologist   Preanesthetic Checklist Completed: patient identified, site marked, surgical consent, pre-op evaluation, timeout performed, IV checked, risks and benefits discussed and monitors and equipment checked  Epidural Patient position: sitting Prep: site prepped and draped and DuraPrep Patient monitoring: continuous pulse ox and blood pressure Approach: midline Location: L3-L4 Injection technique: LOR air  Needle:  Needle type: Tuohy  Needle gauge: 17 G Needle length: 9 cm and 9 Needle insertion depth: 6 cm Catheter type: closed end flexible Catheter size: 19 Gauge Catheter at skin depth: 11 cm Test dose: negative and Other  Assessment Events: blood not aspirated, injection not painful, no injection resistance, negative IV test and no paresthesia  Additional Notes Patient identified. Risks and benefits discussed including failed block, incomplete  Pain control, post dural puncture headache, nerve damage, paralysis, blood pressure Changes, nausea, vomiting, reactions to medications-both toxic and allergic and post Partum back pain. All questions were answered. Patient expressed understanding and wished to proceed. Sterile technique was used throughout procedure. Epidural site was Dressed with sterile barrier dressing. No paresthesias, signs of intravascular injection Or signs of intrathecal spread were encountered.  Patient was more comfortable after the epidural was dosed. Please see RN's note for documentation of vital signs and FHR which are stable.

## 2017-11-02 NOTE — Progress Notes (Signed)
MD progress Note  Called to bedside to evaluate patient's bleeding.  Since delivery she has had several gushes of clots  And bright red blood. Her vital signs are normal and patient denies feeling dizzy, lightheaded.  She denies chest pain or shortness of breath.  Uterine sweep performed and a large amount of clots expelled, possible membranes.   A/P  PPH with possible retained membranes Will get bedside US to ensure there are no retained POC Possible D&C if there are membranes present.  Starting Hb 12  Emily Mason [name]

## 2017-11-03 ENCOUNTER — Encounter (HOSPITAL_COMMUNITY): Payer: Self-pay | Admitting: Obstetrics & Gynecology

## 2017-11-03 LAB — CBC
HCT: 28.9 % — ABNORMAL LOW (ref 36.0–46.0)
Hemoglobin: 9.7 g/dL — ABNORMAL LOW (ref 12.0–15.0)
MCH: 24.4 pg — ABNORMAL LOW (ref 26.0–34.0)
MCHC: 33.6 g/dL (ref 30.0–36.0)
MCV: 72.8 fL — ABNORMAL LOW (ref 78.0–100.0)
Platelets: 169 10*3/uL (ref 150–400)
RBC: 3.97 MIL/uL (ref 3.87–5.11)
RDW: 16.3 % — ABNORMAL HIGH (ref 11.5–15.5)
WBC: 24.1 10*3/uL — ABNORMAL HIGH (ref 4.0–10.5)

## 2017-11-03 LAB — RPR: RPR Ser Ql: NONREACTIVE

## 2017-11-03 MED ORDER — MORPHINE SULFATE (PF) 4 MG/ML IV SOLN: 1.0000 mg | INTRAVENOUS | Status: DC | PRN

## 2017-11-03 MED ORDER — ACETAMINOPHEN 325 MG PO TABS: 650.0000 mg | ORAL_TABLET | ORAL | Status: DC | PRN

## 2017-11-03 MED ORDER — TETANUS-DIPHTH-ACELL PERTUSSIS 5-2.5-18.5 LF-MCG/0.5 IM SUSP: 0.5000 mL | Freq: Once | INTRAMUSCULAR | Status: DC

## 2017-11-03 MED ORDER — WITCH HAZEL-GLYCERIN EX PADS: 1.0000 "application " | MEDICATED_PAD | CUTANEOUS | Status: DC | PRN

## 2017-11-03 MED ORDER — SIMETHICONE 80 MG PO CHEW: 80.0000 mg | CHEWABLE_TABLET | ORAL | Status: DC | PRN

## 2017-11-03 MED ORDER — ONDANSETRON HCL 4 MG/2ML IJ SOLN: 4.0000 mg | INTRAMUSCULAR | Status: DC | PRN

## 2017-11-03 MED ORDER — IBUPROFEN 600 MG PO TABS
600.0000 mg | ORAL_TABLET | Freq: Four times a day (QID) | ORAL | Status: DC
  Administered 2017-11-03 – 2017-11-04 (×3): 600 mg via ORAL
  Filled 2017-11-03 (×4): qty 1

## 2017-11-03 MED ORDER — LACTATED RINGERS IV SOLN: INTRAVENOUS | Status: DC

## 2017-11-03 MED ORDER — IBUPROFEN 600 MG PO TABS: 600.0000 mg | ORAL_TABLET | Freq: Four times a day (QID) | ORAL | Status: DC | PRN

## 2017-11-03 MED ORDER — MENTHOL 3 MG MT LOZG: 1.0000 | LOZENGE | OROMUCOSAL | Status: DC | PRN

## 2017-11-03 MED ORDER — ONDANSETRON HCL 4 MG PO TABS
4.0000 mg | ORAL_TABLET | ORAL | Status: DC | PRN
Start: 2017-11-03 — End: 2017-11-04

## 2017-11-03 MED ORDER — OXYCODONE-ACETAMINOPHEN 5-325 MG PO TABS: 1.0000 | ORAL_TABLET | ORAL | Status: DC | PRN

## 2017-11-03 MED ORDER — DIBUCAINE 1 % RE OINT: 1.0000 "application " | TOPICAL_OINTMENT | RECTAL | Status: DC | PRN

## 2017-11-03 MED ORDER — ZOLPIDEM TARTRATE 5 MG PO TABS
5.0000 mg | ORAL_TABLET | Freq: Every evening | ORAL | Status: DC | PRN
Start: 2017-11-03 — End: 2017-11-04

## 2017-11-03 MED ORDER — PRENATAL MULTIVITAMIN CH
1.0000 | ORAL_TABLET | Freq: Every day | ORAL | Status: DC
  Administered 2017-11-03: 1 via ORAL
  Filled 2017-11-03: qty 1

## 2017-11-03 MED ORDER — ONDANSETRON HCL 4 MG PO TABS: 4.0000 mg | ORAL_TABLET | Freq: Four times a day (QID) | ORAL | Status: DC | PRN

## 2017-11-03 MED ORDER — SENNOSIDES-DOCUSATE SODIUM 8.6-50 MG PO TABS
2.0000 | ORAL_TABLET | ORAL | Status: DC
  Administered 2017-11-04: 2 via ORAL
  Filled 2017-11-03: qty 2

## 2017-11-03 MED ORDER — DIPHENHYDRAMINE HCL 25 MG PO CAPS
25.0000 mg | ORAL_CAPSULE | Freq: Four times a day (QID) | ORAL | Status: DC | PRN
Start: 2017-11-03 — End: 2017-11-04

## 2017-11-03 MED ORDER — ONDANSETRON HCL 4 MG/2ML IJ SOLN: 4.0000 mg | Freq: Four times a day (QID) | INTRAMUSCULAR | Status: DC | PRN

## 2017-11-03 MED ORDER — COCONUT OIL OIL: 1.0000 "application " | TOPICAL_OIL | Status: DC | PRN

## 2017-11-03 MED ORDER — BENZOCAINE-MENTHOL 20-0.5 % EX AERO: 1.0000 "application " | INHALATION_SPRAY | CUTANEOUS | Status: DC | PRN

## 2017-11-03 NOTE — Addendum Note (Signed)
Addendum  created 11/03/17 0742 by Earmon PhoenixWilkerson, Lachae Hohler P, CRNA   Sign clinical note

## 2017-11-03 NOTE — Discharge Instructions (Signed)
Postpartum Care After Vaginal Delivery °The period of time right after you deliver your newborn is called the postpartum period. °What kind of medical care will I receive? °· You may continue to receive fluids and medicines through an IV tube inserted into one of your veins. °· If an incision was made near your vagina (episiotomy) or if you had some vaginal tearing during delivery, cold compresses may be placed on your episiotomy or your tear. This helps to reduce pain and swelling. °· You may be given a squirt bottle to use when you go to the bathroom. You may use this until you are comfortable wiping as usual. To use the squirt bottle, follow these steps: °? Before you urinate, fill the squirt bottle with warm water. Do not use hot water. °? After you urinate, while you are sitting on the toilet, use the squirt bottle to rinse the area around your urethra and vaginal opening. This rinses away any urine and blood. °? You may do this instead of wiping. As you start healing, you may use the squirt bottle before wiping yourself. Make sure to wipe gently. °? Fill the squirt bottle with clean water every time you use the bathroom. °· You will be given sanitary pads to wear. °How can I expect to feel? °· You may not feel the need to urinate for several hours after delivery. °· You will have some soreness and pain in your abdomen and vagina. °· If you are breastfeeding, you may have uterine contractions every time you breastfeed for up to several weeks postpartum. Uterine contractions help your uterus return to its normal size. °· It is normal to have vaginal bleeding (lochia) after delivery. The amount and appearance of lochia is often similar to a menstrual period in the first week after delivery. It will gradually decrease over the next few weeks to a dry, yellow-brown discharge. For most women, lochia stops completely by 6-8 weeks after delivery. Vaginal bleeding can vary from woman to woman. °· Within the first few  days after delivery, you may have breast engorgement. This is when your breasts feel heavy, full, and uncomfortable. Your breasts may also throb and feel hard, tightly stretched, warm, and tender. After this occurs, you may have milk leaking from your breasts. Your health care provider can help you relieve discomfort due to breast engorgement. Breast engorgement should go away within a few days. °· You may feel more sad or worried than normal due to hormonal changes after delivery. These feelings should not last more than a few days. If these feelings do not go away after several days, speak with your health care provider. °How should I care for myself? °· Tell your health care provider if you have pain or discomfort. °· Drink enough water to keep your urine clear or pale yellow. °· Wash your hands thoroughly with soap and water for at least 20 seconds after changing your sanitary pads, after using the toilet, and before holding or feeding your baby. °· If you are not breastfeeding, avoid touching your breasts a lot. Doing this can make your breasts produce more milk. °· If you become weak or lightheaded, or you feel like you might faint, ask for help before: °? Getting out of bed. °? Showering. °· Change your sanitary pads frequently. Watch for any changes in your flow, such as a sudden increase in volume, a change in color, the passing of large blood clots. If you pass a blood clot from your vagina, save it   to show to your health care provider. Do not flush blood clots down the toilet without having your health care provider look at them. °· Make sure that all your vaccinations are up to date. This can help protect you and your baby from getting certain diseases. You may need to have immunizations done before you leave the hospital. °· If desired, talk with your health care provider about methods of family planning or birth control (contraception). °How can I start bonding with my baby? °Spending as much time as  possible with your baby is very important. During this time, you and your baby can get to know each other and develop a bond. Having your baby stay with you in your room (rooming in) can give you time to get to know your baby. Rooming in can also help you become comfortable caring for your baby. Breastfeeding can also help you bond with your baby. °How can I plan for returning home with my baby? °· Make sure that you have a car seat installed in your vehicle. °? Your car seat should be checked by a certified car seat installer to make sure that it is installed safely. °? Make sure that your baby fits into the car seat safely. °· Ask your health care provider any questions you have about caring for yourself or your baby. Make sure that you are able to contact your health care provider with any questions after leaving the hospital. °This information is not intended to replace advice given to you by your health care provider. Make sure you discuss any questions you have with your health care provider. °Document Released: 05/25/2007 Document Revised: 12/31/2015 Document Reviewed: 07/02/2015 °Elsevier Interactive Patient Education © 2018 [employer] ° °

## 2017-11-03 NOTE — Progress Notes (Signed)
Subjective: Postpartum Day 1: Vaginal delivery, no laceration,   Patient up ad lib, reports no syncope or dizziness. Feeding:  Breast/bottle Contraceptive plan:  undecided  Objective: Vital signs in last 24 hours: Temp:  [98.4 F (36.9 C)-99.2 F (37.3 C)] 99.1 F (37.3 C) (03/26 0517) Pulse Rate:  [84-118] 95 (03/26 0517) Resp:  [14-21] 20 (03/26 0517) BP: (113-148)/(62-103) 137/72 (03/26 0517) SpO2:  [96 %-100 %] 96 % (03/26 0517) Weight:  [101.6 kg (224 lb)-101.7 kg (224 lb 4 oz)] 101.6 kg (224 lb) (03/25 1202)  Physical Exam:  General: alert, cooperative and no distress Lochia: appropriate Uterine Fundus: firm Perineum: healing well DVT Evaluation: No evidence of DVT seen on physical exam. Negative Homan's sign.   CBC Latest Ref Rng & Units 11/03/2017 11/02/2017 02/22/2016  WBC 4.0 - 10.5 K/uL 24.1(H) 21.6(H) 9.8  Hemoglobin 12.0 - 15.0 g/dL 1.6(X9.7(L) 09.612.2 11.3(L)  Hematocrit 36.0 - 46.0 % 28.9(L) 36.3 35.1(L)  Platelets 150 - 400 K/uL 169 162 215     Assessment/Plan: Status post vaginal delivery day 1.  Postop day 1 from d and c due to retained products. Stable Continue current care. Plan for discharge tomorrow and Breastfeeding    Henderson Newcomerancy Jean ProtheroCNM 11/03/2017, 7:39 AM

## 2017-11-03 NOTE — Discharge Summary (Signed)
OB Discharge Summary     Patient Name: Emily Mason DOB: [birth year] MRN: [id number]  Date of admission: 11/02/2017 Delivering MD: Emily Mason   Date of discharge: 11/04/2017  Admitting diagnosis: 40WKS,WATER BROKE Intrauterine pregnancy: [redacted]w[redacted]d     Secondary diagnosis:  Principal Problem:   Vaginal delivery Active Problems:   Postpartum hemorrhage  Additional problems: NA     Discharge diagnosis: Term Pregnancy Delivered and PPH                                                                                                Post partum procedures:curettage   Augmentation: Pitocin  Complications: Hemorrhage>1024mL  Hospital course:  Onset of Labor With Vaginal Delivery     32 y.o. yo [id number] at [redacted]w[redacted]d was admitted in Active Labor on 11/02/2017. Patient had an uncomplicated labor course as follows:  Membrane Rupture Time/Date: 10:01 AM ,11/02/2017   Intrapartum Procedures: Episiotomy: None [1]                                         Lacerations:  None [1]  Patient had Mason delivery of Mason Viable infant. 11/02/2017  Information for the patient's newborn:  Emily, Mason [[id number]]  Delivery Method: Vag-Spont   Baby admitted to NICU for TTN, on ATB, doing well.  See delivery note and f/u notes for details--patient had PP hemorrhage in early pp phase, requiring D&C for retained membranes/POC after delivery.  Blood loss was approx 1000 cc during procedure, but patient tolerated the procedure well, and had no further complications during her pp stay.  Her hgb was 9.7 on day 1, without any symptoms.    She is ambulating, tolerating Mason regular diet, passing flatus, and urinating well. Patient is discharged home in stable condition on 11/04/17. She will continue Fe supplementation.   Physical exam  Vitals:   11/03/17 0755 11/03/17 1310 11/03/17 1700 11/03/17 2348  BP: 123/79 121/81 121/65 122/71  Pulse: 91 97 99 92  Resp: 20 18 18 17   Temp: 98.9 F (37.2 C) 98.6 F (37 C)  98.9 F (37.2 C) 98.5 F (36.9 C)  TempSrc: Oral Oral Oral Oral  SpO2: 98% 99% 99% 98%  Weight:      Height:       General: alert Lochia: appropriate Uterine Fundus: firm Incision: Perineum intact DVT Evaluation: No evidence of DVT seen on physical exam. Negative Homan's sign. Labs: CBC Latest Ref Rng & Units 11/03/2017 11/02/2017 02/22/2016  WBC 4.0 - 10.5 K/uL 24.1(H) 21.6(H) 9.8  Hemoglobin 12.0 - 15.0 g/dL 6.9(G) 29.5 11.3(L)  Hematocrit 36.0 - 46.0 % 28.9(L) 36.3 35.1(L)  Platelets 150 - 400 K/uL 169 162 215   CMP Latest Ref Rng & Units 11/02/2017  Glucose 65 - 99 mg/dL 284(X)  BUN 6 - 20 mg/dL 14  Creatinine 3.24 - 4.01 mg/dL 0.27(O)  Sodium 536 - 644 mmol/L 134(L)  Potassium 3.5 - 5.1 mmol/L 3.8  Chloride 101 - 111 mmol/L 104  CO2 22 -  32 mmol/L 19(L)  Calcium 8.9 - 10.3 mg/dL 8.3(L)  Total Protein 6.5 - 8.1 g/dL 7.0  Total Bilirubin 0.3 - 1.2 mg/dL 0.5  Alkaline Phos 38 - 126 U/L 158(H)  AST 15 - 41 U/L 26  ALT 14 - 54 U/L 23    Discharge instruction: per After Visit Summary and "Baby and Me Booklet".  After visit meds:  Allergies as of 11/04/2017      Reactions   Food Swelling   eggplant      Medication List    TAKE these medications   ferrous sulfate 325 (65 FE) MG tablet Take 325 mg by mouth daily with breakfast.   ibuprofen 600 MG tablet Commonly known as:  ADVIL,MOTRIN Take 1 tablet (600 mg total) by mouth every 6 (six) hours as needed.   prenatal multivitamin Tabs tablet Take 1 tablet by mouth daily at 12 noon.      Diet: routine diet  Activity: Advance as tolerated. Pelvic rest for 6 weeks.   Outpatient follow up:6 weeks Follow up Appt:No future appointments. Follow up Visit:No follow-ups on file.  Postpartum contraception: Nexplanon  Newborn Data: Live born female  Birth Weight: 8 lb 2.9 oz (3710 g) APGAR: 5, 8  Newborn Delivery   Birth date/time:  11/02/2017 15:32:00 Delivery type:  Vaginal, Spontaneous     Baby Feeding:  Breast Disposition:NICU, with d/c anticipated on 11/06/17   11/04/2017 Emily Mason, CNM

## 2017-11-03 NOTE — Anesthesia Postprocedure Evaluation (Signed)
Anesthesia Post Note  Patient: Emily Mason  Procedure(s) Performed: DILATATION AND CURETTAGE (N/A )     Patient location during evaluation: [facility] Anesthesia Type: General Pain management: pain level controlled Vital Signs Assessment: post-procedure vital signs reviewed and stable Respiratory status: spontaneous breathing Cardiovascular status: stable Postop Assessment: no apparent nausea or vomiting and adequate PO intake Anesthetic complications: no    Last Vitals:  Vitals:   11/03/17 0051 11/03/17 0517  BP: 125/75 137/72  Pulse: 95 95  Resp: 18 20  Temp: 37.3 C 37.3 C  SpO2: 96% 96%    Last Pain:  Vitals:   11/03/17 0620  TempSrc:   PainSc: Asleep   Pain Goal: Patients Stated Pain Goal: 3 (11/02/17 2345)               Edison PaceWILKERSON,Islah Eve

## 2017-11-03 NOTE — Anesthesia Postprocedure Evaluation (Signed)
Anesthesia Post Note  Patient: Emily Mason  Procedure(s) Performed: AN AD HOC LABOR EPIDURAL     Patient location during evaluation: Mother Baby Anesthesia Type: Epidural Level of consciousness: awake Pain management: pain level controlled Vital Signs Assessment: post-procedure vital signs reviewed and stable Respiratory status: spontaneous breathing Cardiovascular status: stable Postop Assessment: epidural receding and patient able to bend at knees Anesthetic complications: no    Last Vitals:  Vitals:   11/03/17 0051 11/03/17 0517  BP: 125/75 137/72  Pulse: 95 95  Resp: 18 20  Temp: 37.3 C 37.3 C  SpO2: 96% 96%    Last Pain:  Vitals:   11/03/17 0620  TempSrc:   PainSc: Asleep   Pain Goal: Patients Stated Pain Goal: 3 (11/02/17 2345)               Edison PaceWILKERSON,Kirtan Sada

## 2017-11-03 NOTE — Anesthesia Postprocedure Evaluation (Signed)
Anesthesia Post Note  Patient: Emily Mason  Procedure(s) Performed: DILATATION AND CURETTAGE (N/A )     Patient location during evaluation: PACU Anesthesia Type: General Level of consciousness: awake and alert Pain management: pain level controlled Vital Signs Assessment: post-procedure vital signs reviewed and stable Respiratory status: spontaneous breathing, nonlabored ventilation, respiratory function stable and patient connected to nasal cannula oxygen Cardiovascular status: blood pressure returned to baseline and stable Postop Assessment: no apparent nausea or vomiting Anesthetic complications: no    Last Vitals:  Vitals:   11/02/17 2358 11/03/17 0051  BP: 133/81 125/75  Pulse: 86 95  Resp: 20 18  Temp: 37.1 C 37.3 C  SpO2: 96% 96%    Last Pain:  Vitals:   11/03/17 0051  TempSrc: Oral  PainSc: 0-No pain   Pain Goal: Patients Stated Pain Goal: 3 (11/02/17 2345)               Shelton SilvasKevin D Errik Mitchelle

## 2017-11-03 NOTE — Lactation Note (Signed)
This note was copied from a baby's chart. Lactation Consultation Note  Patient Name: Boy Cherre HugerHjuel Garate MVHQI'OToday's Date: 11/03/2017 Reason for consult: Initial assessment;Term;NICU baby  Visited with P3 Mom of term baby that transferred to NICU with some respiratory difficulty.    Mom has been with baby all morning in the NICU.  Set up DEBP at bedside.  RN will assist with first pumping.  Mom aware of routine, and cleaning of pump parts.  Encouraged >8 pumpings in 24 hrs.  Lactation brochure and NICU brochure given to Mom. Mom aware of lactation support available to her.  Encouraged Mom to call prn.   Interventions Interventions: Breast feeding basics reviewed;Skin to skin;Breast massage;Hand express;Expressed milk;DEBP  Lactation Tools Discussed/Used Tools: Pump Breast pump type: Double-Electric Breast Pump WIC Program: No Pump Review: Setup, frequency, and cleaning;Milk Storage Initiated by:: Erby Pian Carlyon Nolasco RN IBCLC Date initiated:: 11/03/17   Consult Status Consult Status: Follow-up Date: 11/04/17 Follow-up type: In-patient    Judee ClaraSmith, Tanis Burnley E 11/03/2017, 1:24 PM

## 2017-11-04 MED ORDER — IBUPROFEN 600 MG PO TABS: 600.0000 mg | ORAL_TABLET | Freq: Four times a day (QID) | ORAL | 2 refills | Status: DC | PRN

## 2017-11-04 NOTE — Progress Notes (Signed)
Discharge instructions given.  Postpartum education reviewed.  Patient had no questions.  Follow up appointments and medications discussed.  Paperwork signed.

## 2019-12-21 LAB — RESULTS CONSOLE HPV: CHL HPV: NEGATIVE

## 2019-12-21 LAB — HM PAP SMEAR: HM Pap smear: NORMAL

## 2020-07-10 ENCOUNTER — Ambulatory Visit: Payer: 59 | Admitting: Family Medicine

## 2020-07-10 ENCOUNTER — Encounter: Payer: Self-pay | Admitting: Family Medicine

## 2020-07-10 ENCOUNTER — Other Ambulatory Visit: Payer: Self-pay

## 2020-07-10 VITALS — BP 130/82 | HR 95 | Ht 64.0 in | Wt 210.2 lb

## 2020-07-10 DIAGNOSIS — F4322 Adjustment disorder with anxiety: Secondary | ICD-10-CM | POA: Diagnosis not present

## 2020-07-10 DIAGNOSIS — E669 Obesity, unspecified: Secondary | ICD-10-CM

## 2020-07-10 DIAGNOSIS — Z13220 Encounter for screening for lipoid disorders: Secondary | ICD-10-CM

## 2020-07-10 DIAGNOSIS — Z1329 Encounter for screening for other suspected endocrine disorder: Secondary | ICD-10-CM

## 2020-07-10 DIAGNOSIS — Z0000 Encounter for general adult medical examination without abnormal findings: Secondary | ICD-10-CM | POA: Diagnosis not present

## 2020-07-10 DIAGNOSIS — Z2821 Immunization not carried out because of patient refusal: Secondary | ICD-10-CM

## 2020-07-10 LAB — CBC WITH DIFFERENTIAL/PLATELET
Basophils Absolute: 0 10*3/uL (ref 0.0–0.2)
Lymphocytes Absolute: 1.9 10*3/uL (ref 0.7–3.1)
MCV: 77 fL — ABNORMAL LOW (ref 79–97)
Neutrophils Absolute: 4.8 10*3/uL (ref 1.4–7.0)

## 2020-07-10 LAB — LIPID PANEL

## 2020-07-10 LAB — COMPREHENSIVE METABOLIC PANEL

## 2020-07-10 LAB — T3

## 2020-07-10 LAB — T4, FREE

## 2020-07-10 NOTE — Patient Instructions (Addendum)
You can call to schedule your appointment with the counselor. A few offices are listed below for you to call.  °  °[facility] °[address] °[address] °[place], [place] [address]  °Phone: [phone] ° °[facility] ° [address], [place], [place] [address]  °Phone: [phone] ° °[facility] °[address] °[place], [place] [address] °[phone] for appointments  ° °[facility]  °[address] °[place], [place] [address] °[phone] ° °[facility]  °[address]  °(across from [facility])  °[phone]  ° °[facility] [place] °[address], [place], [place] [address] °[phone] ° °  °[facility] °[address] °[place],  [address] °[phone] °  ° ° ° °Preventive Care 21-39 Years Old, Female °Preventive care refers to visits with your health care provider and lifestyle choices that can promote health and wellness. This includes: °· A yearly physical exam. This may also be called an annual well check. °· Regular dental visits and eye exams. °· Immunizations. °· Screening for certain conditions. °· Healthy lifestyle choices, such as eating a healthy diet, getting regular exercise, not using drugs or products that contain nicotine and tobacco, and limiting alcohol use. °What can I expect for my preventive care visit? °Physical exam °Your health care provider will check your: °· Height and weight. This may be used to calculate body mass index (BMI), which tells if you are at a healthy weight. °· Heart rate and blood pressure. °· Skin for abnormal spots. °Counseling °Your health care provider may ask you questions about your: °· Alcohol, tobacco, and drug use. °· Emotional well-being. °· Home and relationship well-being. °· Sexual activity. °· Eating habits. °· Work and work environment. °· Method of  birth control. °· Menstrual cycle. °· Pregnancy history. °What immunizations do I need? ° °Influenza (flu) vaccine °· This is recommended every year. °Tetanus, diphtheria, and pertussis (Tdap) vaccine °· You may need a Td booster every 10 years. °Varicella (chickenpox) vaccine °· You may need this if you have not been vaccinated. °Human papillomavirus (HPV) vaccine °· If recommended by your health care provider, you may need three doses over 6 months. °Measles, mumps, and rubella (MMR) vaccine °· You may need at least one dose of MMR. You may also need a second dose. °Meningococcal conjugate (MenACWY) vaccine °· One dose is recommended if you are age 19-21 years and a first-year college student living in a residence hall, or if you have one of several medical conditions. You may also need additional booster doses. °Pneumococcal conjugate (PCV13) vaccine °· You may need this if you have certain conditions and were not previously vaccinated. °Pneumococcal polysaccharide (PPSV23) vaccine °· You may need one or two doses if you smoke cigarettes or if you have certain conditions. °Hepatitis A vaccine °· You may need this if you have certain conditions or if you travel or work in places where you may be exposed to hepatitis A. °Hepatitis B vaccine °· You may need this if you have certain conditions or if you travel or work in places where you may be exposed to hepatitis B. °Haemophilus influenzae type b (Hib) vaccine °· You may need this if you have certain conditions. °You may receive vaccines as individual doses or as more than one vaccine together in one shot (combination vaccines). Talk with your health care provider about the   risks and benefits of combination vaccines. °What tests do I need? ° °Blood tests °· Lipid and cholesterol levels. These may be checked every 5 years starting at age 20. °· Hepatitis C test. °· Hepatitis B test. °Screening °· Diabetes screening. This is done by checking your blood sugar  (glucose) after you have not eaten for a while (fasting). °· Sexually transmitted disease (STD) testing. °· BRCA-related cancer screening. This may be done if you have a family history of breast, ovarian, tubal, or peritoneal cancers. °· Pelvic exam and Pap test. This may be done every 3 years starting at age 21. Starting at age 30, this may be done every 5 years if you have a Pap test in combination with an HPV test. °Talk with your health care provider about your test results, treatment options, and if necessary, the need for more tests. °Follow these instructions at home: °Eating and drinking ° °· Eat a diet that includes fresh fruits and vegetables, whole grains, lean protein, and low-fat dairy. °· Take vitamin and mineral supplements as recommended by your health care provider. °· Do not drink alcohol if: °? Your health care provider tells you not to drink. °? You are pregnant, may be pregnant, or are planning to become pregnant. °· If you drink alcohol: °? Limit how much you have to 0-1 drink a day. °? Be aware of how much alcohol is in your drink. In the [place] one drink equals one 12 oz bottle of beer (355 mL), one 5 oz glass of wine (148 mL), or one 1½ oz glass of hard liquor (44 mL). °Lifestyle °· Take daily care of your teeth and gums. °· Stay active. Exercise for at least 30 minutes on 5 or more days each week. °· Do not use any products that contain nicotine or tobacco, such as cigarettes, e-cigarettes, and chewing tobacco. If you need help quitting, ask your health care provider. °· If you are sexually active, practice safe sex. Use a condom or other form of birth control (contraception) in order to prevent pregnancy and STIs (sexually transmitted infections). If you plan to become pregnant, see your health care provider for a preconception visit. °What's next? °· Visit your health care provider once a year for a well check visit. °· Ask your health care provider how often you should have your eyes and  teeth checked. °· Stay up to date on all vaccines. °This information is not intended to replace advice given to you by your health care provider. Make sure you discuss any questions you have with your health care provider. °Document Revised: 04/08/2018 Document Reviewed: 04/08/2018 °Elsevier Patient Education © 2020 Elsevier Inc. ° °

## 2020-07-10 NOTE — Progress Notes (Signed)
Subjective:    Patient ID: Emily Mason, female    DOB: [birth year], 34 y.o.   MRN: [id number]  HPI Chief Complaint  Patient presents with  . new pt    new pt, get established, no concerns, declines flu shot   She is new to the practice and here for a complete physical exam. Born in [place]. Moved from [place] at age 55.  Previous medical care: no PCP in years.  Last CPE: years ago  Other providers: OB/GYN- Nigel Bridgeman at [facility]   She denies any significant past medical history.  She would like to lose weight.  She is aware that she is obese.  States she has been "emotionally eating".  Reports dealing with the death of a good friend recently and going through a separation.  She has been in counseling in the past but does not currently have a Veterinary surgeon.  She is interested in scheduling with 1.   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  She does report eating a lot of rice as this is the food staple in her culture Excerise: walks a lot with her job.  States she used to run a lot more.  Immunizations: flu and Covid vaccines declined   Health maintenance:  Mammogram: N/A Colonoscopy: N/A Last Gynecological Exam: earlier this year  Last Menstrual cycle: Mirena  Last Dental Exam: 2 years ago  Last Eye Exam: last year and has contact lenses and glasses   Wears seatbelt always, smoke detectors in home and functioning, does not text while driving and feels safe in home environment.   Reviewed allergies, medications, past medical, surgical, family, and social history.    Review of Systems Pertinent positives and negatives in the history of present illness.     Objective:   Physical Exam BP 130/82   Pulse 95   Ht 5\' 4"  (1.626 m)   Wt 210 lb 3.2 oz (95.3 kg)   LMP 07/02/2020   BMI 36.08 kg/m   General Appearance:    Alert, cooperative, no  distress, appears stated age  Head:    Normocephalic, without obvious abnormality, atraumatic  Eyes:    PERRL, conjunctiva/corneas clear, EOM's intact  Ears:    Normal TM's and external ear canals  Nose:  Mask on  Throat:  Mask on  Neck:   Supple, no lymphadenopathy;  thyroid:  no   enlargement/tenderness/nodules; no carotid   bruit or JVD  Back:    Spine nontender, no curvature, ROM normal, no CVA     tenderness  Lungs:     Clear to auscultation bilaterally without wheezes, rales or     ronchi; respirations unlabored  Chest Wall:    No tenderness or deformity   Heart:    Regular rate and rhythm, S1 and S2 normal, no murmur, rub   or gallop  Breast Exam:   OB/GYN  Abdomen:     Soft, non-tender, nondistended, normoactive bowel sounds,    no masses, no hepatosplenomegaly  Genitalia:   OB/GYN  Rectal:    Not performed due to age<40 and no related complaints  Extremities:   No clubbing, cyanosis or edema  Pulses:   2+ and symmetric all extremities  Skin:   Skin color, texture, turgor normal, no rashes or lesions  Lymph nodes:   Cervical, supraclavicular, and axillary nodes normal  Neurologic:   CNII-XII  intact, normal strength, sensation and gait; reflexes 2+ and symmetric throughout          Psych:   Normal mood, affect, hygiene and grooming.           Assessment & Plan:  Routine general medical examination at a health care facility - Plan: CBC with Differential/Platelet, Comprehensive metabolic panel, TSH, T4, free, T3, Lipid panel -She is new to the practice and here to establish care.  She is fasting.  Preventive health care reviewed.  She is up-to-date with her OB/GYN visits.  Overdue for dental exam and she plans to call and schedule.  Counseling on healthy lifestyle including diet and exercise.  Recommend cutting back carbohydrates and sugar.  Immunizations reviewed and she declines flu vaccine as well as Covid vaccine.  I do recommend these.  Discussed safety and health  promotion.  Adjustment disorder with anxiety -I will provide her with a list of counselors and she will call to schedule at her convenience.  Discussed counseling can help her with stopping the emotional eating that is most likely causing her obesity.  Obesity (BMI 30-39.9) - Plan: TSH, T4, free, T3, Lipid panel -Discussed limiting carbohydrates and sugar and increasing physical activity.  She will also seek counseling due to recent overeating.  Screening for thyroid disorder - Plan: TSH, T4, free, T3  Screening for lipid disorders - Plan: Lipid panel  COVID-19 vaccination declined -She is aware that I recommend this and she can call and schedule a vaccine appointment at her convenience.

## 2020-07-11 LAB — CBC WITH DIFFERENTIAL/PLATELET
Basos: 1 %
EOS (ABSOLUTE): 0.1 10*3/uL (ref 0.0–0.4)
Eos: 1 %
Hematocrit: 40.4 % (ref 34.0–46.6)
Hemoglobin: 12.9 g/dL (ref 11.1–15.9)
Immature Grans (Abs): 0 10*3/uL (ref 0.0–0.1)
Immature Granulocytes: 0 %
Lymphs: 26 %
MCH: 24.7 pg — ABNORMAL LOW (ref 26.6–33.0)
MCHC: 31.9 g/dL (ref 31.5–35.7)
Monocytes Absolute: 0.5 10*3/uL (ref 0.1–0.9)
Monocytes: 7 %
Neutrophils: 65 %
Platelets: 247 10*3/uL (ref 150–450)
RBC: 5.22 x10E6/uL (ref 3.77–5.28)
RDW: 13.9 % (ref 11.7–15.4)
WBC: 7.4 10*3/uL (ref 3.4–10.8)

## 2020-07-11 LAB — COMPREHENSIVE METABOLIC PANEL
ALT: 31 IU/L (ref 0–32)
AST: 25 IU/L (ref 0–40)
Albumin/Globulin Ratio: 1.3 (ref 1.2–2.2)
Albumin: 4.3 g/dL (ref 3.8–4.8)
BUN/Creatinine Ratio: 21 (ref 9–23)
BUN: 13 mg/dL (ref 6–20)
Bilirubin Total: 0.6 mg/dL (ref 0.0–1.2)
CO2: 24 mmol/L (ref 20–29)
Chloride: 101 mmol/L (ref 96–106)
Creatinine, Ser: 0.61 mg/dL (ref 0.57–1.00)
GFR calc Af Amer: 139 mL/min/{1.73_m2} (ref >59)
GFR calc non Af Amer: 120 mL/min/{1.73_m2} (ref >59)
Globulin, Total: 3.4 g/dL (ref 1.5–4.5)
Glucose: 85 mg/dL (ref 65–99)
Potassium: 4.4 mmol/L (ref 3.5–5.2)
Total Protein: 7.7 g/dL (ref 6.0–8.5)

## 2020-07-11 LAB — LIPID PANEL
Chol/HDL Ratio: 3.4 ratio (ref 0.0–4.4)
LDL Chol Calc (NIH): 95 mg/dL (ref 0–99)
Triglycerides: 91 mg/dL (ref 0–149)
VLDL Cholesterol Cal: 17 mg/dL (ref 5–40)

## 2020-07-11 LAB — TSH: TSH: 1.64 u[IU]/mL (ref 0.450–4.500)

## 2020-08-07 ENCOUNTER — Telehealth: Payer: 59 | Admitting: Medical

## 2020-08-07 ENCOUNTER — Other Ambulatory Visit (INDEPENDENT_AMBULATORY_CARE_PROVIDER_SITE_OTHER): Payer: 59

## 2020-08-07 ENCOUNTER — Other Ambulatory Visit: Payer: Self-pay

## 2020-08-07 VITALS — Temp 103.2°F | Wt 210.0 lb

## 2020-08-07 DIAGNOSIS — R509 Fever, unspecified: Secondary | ICD-10-CM

## 2020-08-07 DIAGNOSIS — R52 Pain, unspecified: Secondary | ICD-10-CM

## 2020-08-07 DIAGNOSIS — R059 Cough, unspecified: Secondary | ICD-10-CM

## 2020-08-07 DIAGNOSIS — R6889 Other general symptoms and signs: Secondary | ICD-10-CM

## 2020-08-07 LAB — POC COVID19 BINAXNOW: SARS Coronavirus 2 Ag: POSITIVE — AB

## 2020-08-07 LAB — POCT INFLUENZA A/B
Influenza A, POC: NEGATIVE
Influenza B, POC: NEGATIVE

## 2020-08-07 NOTE — Progress Notes (Signed)
Subjective:     Patient ID: Emily Mason, female   DOB: [birth year], 34 y.o.   MRN: [id number]  This visit type was conducted due to national recommendations for restrictions regarding the COVID-19 Pandemic (e.g. social distancing) in an effort to limit this patient's exposure and mitigate transmission in our community.  Due to their co-morbid illnesses, this patient is at least at moderate risk for complications without adequate follow up.  This format is felt to be most appropriate for this patient at this time.    Documentation for virtual audio and video telecommunications through [facility] encounter:  The patient was located at home. The provider was located in the office. The patient did consent to this visit and is aware of possible charges through their insurance for this visit.  The other persons participating in this telemedicine service were none. Time spent on call was 20 minutes and in review of previous records 20 minutes total.  This virtual service is not related to other E/M service within previous 7 days.   HPI Chief Complaint  Patient presents with  . possible flu,covid    Sunday symptoms- body aches, fever, cold sweats, cough, congestion, migraine, runny nose   Virtual consult today for feeling bad, illness.  having body aches, headache, sore throat, cough, fever last night, chills.  Symptoms began 3 days and worse last night.  No sick contacts.  Using nyquil.  Using alternating tylenol and ibuprofen.  No loss in taste and smell.    Has had some nausea, but no vomiting or diarrhea.   No SOB or wheezing.     Nonsmoker.  No prior vaccine for covid, no prior covid infection.  No recent testing.    Review of Systems As in subjective    Objective:   Physical Exam Due to coronavirus pandemic stay at home measures, patient visit was virtual and they were not examined in person.   Temp (!) 103.2 F (39.6 C)   Wt 210 lb (95.3 kg)   BMI 36.05 kg/m  Gen: ill  appearing, congested sounding No obvious dyspnea or wheezing Psych: Answers questions appropriately in complete sentences     Assessment:     Encounter Diagnoses  Name Primary?  . Fever, unspecified fever cause Yes  . Body aches   . Cough   . Flu-like symptoms        Plan:     We discussed her symptoms and concerns.  She will come in today for testing as below  We discussed quarantine for now.  Unfortunately several members in her household have been around her so they may need to be tested as well.  We discussed supportive measures, rest, hydration, salt water gargles, warm fluids, alternating Tylenol and ibuprofen, continuing DayQuil NyQuil remedies  We discussed the EmergenC immune plus vitamin pack over-the-counter  Advised over the next few days if much worse or new symptoms to call back if questions or concerns  We discussed usual course of illness for flu versus Covid and possible complications  Mattisen was seen today for possible flu,covid.  Diagnoses and all orders for this visit:  Fever, unspecified fever cause -     Influenza A/B; Future -     POC COVID-19 BinaxNow; Future -     Novel Coronavirus, NAA ([facility]); Future  Body aches -     Influenza A/B; Future -     POC COVID-19 BinaxNow; Future -     Novel Coronavirus, NAA ([facility]); Future  Cough -  Influenza A/B; Future -     POC COVID-19 BinaxNow; Future -     Novel Coronavirus, NAA ([facility]); Future  Flu-like symptoms -     Influenza A/B; Future -     POC COVID-19 BinaxNow; Future -     Novel Coronavirus, NAA ([facility]); Future  She will follow up today in our back parking lot for testing above

## 2020-08-08 ENCOUNTER — Encounter: Payer: Self-pay | Admitting: Family Medicine

## 2020-08-13 ENCOUNTER — Other Ambulatory Visit: Payer: Self-pay | Admitting: Medical

## 2020-08-13 DIAGNOSIS — R059 Cough, unspecified: Secondary | ICD-10-CM

## 2020-08-13 DIAGNOSIS — U071 COVID-19: Secondary | ICD-10-CM

## 2020-08-14 ENCOUNTER — Other Ambulatory Visit: Payer: Self-pay

## 2020-08-14 ENCOUNTER — Ambulatory Visit (HOSPITAL_COMMUNITY)
Admission: RE | Admit: 2020-08-14 | Discharge: 2020-08-14 | Disposition: A | Discharge status: Home / Self Care | Payer: BLUE CROSS/BLUE SHIELD | Source: Ambulatory Visit | Attending: Medical | Admitting: Medical

## 2020-08-14 ENCOUNTER — Other Ambulatory Visit: Payer: Self-pay | Admitting: Medical

## 2020-08-14 DIAGNOSIS — U071 COVID-19: Secondary | ICD-10-CM | POA: Diagnosis present

## 2020-08-14 DIAGNOSIS — R059 Cough, unspecified: Secondary | ICD-10-CM | POA: Diagnosis not present

## 2020-08-14 MED ORDER — ALBUTEROL SULFATE HFA 108 (90 BASE) MCG/ACT IN AERS: 2.0000 | INHALATION_SPRAY | Freq: Four times a day (QID) | RESPIRATORY_TRACT | 0 refills | Status: DC | PRN

## 2020-08-14 MED ORDER — HYDROCOD POLST-CPM POLST ER 10-8 MG/5ML PO SUER: 5.0000 mL | Freq: Two times a day (BID) | ORAL | 0 refills | Status: DC

## 2021-01-03 ENCOUNTER — Encounter: Payer: Self-pay | Admitting: Family Medicine

## 2021-01-03 ENCOUNTER — Other Ambulatory Visit (INDEPENDENT_AMBULATORY_CARE_PROVIDER_SITE_OTHER): Payer: BLUE CROSS/BLUE SHIELD

## 2021-01-03 ENCOUNTER — Other Ambulatory Visit: Payer: Self-pay | Admitting: *Deleted

## 2021-01-03 ENCOUNTER — Telehealth: Payer: Self-pay

## 2021-01-03 ENCOUNTER — Telehealth: Payer: BLUE CROSS/BLUE SHIELD | Admitting: Family Medicine

## 2021-01-03 ENCOUNTER — Other Ambulatory Visit: Payer: Self-pay

## 2021-01-03 VITALS — Temp 101.4°F | Ht 64.0 in | Wt 197.0 lb

## 2021-01-03 DIAGNOSIS — R059 Cough, unspecified: Secondary | ICD-10-CM

## 2021-01-03 DIAGNOSIS — J029 Acute pharyngitis, unspecified: Secondary | ICD-10-CM

## 2021-01-03 DIAGNOSIS — R6883 Chills (without fever): Secondary | ICD-10-CM

## 2021-01-03 DIAGNOSIS — U071 COVID-19: Secondary | ICD-10-CM

## 2021-01-03 LAB — POCT INFLUENZA A/B
Influenza A, POC: NEGATIVE
Influenza B, POC: NEGATIVE

## 2021-01-03 LAB — POC COVID19 BINAXNOW: SARS Coronavirus 2 Ag: POSITIVE — AB

## 2021-01-03 NOTE — Telephone Encounter (Signed)
Out of work note typed and emailed to patient.  Called pt and informed and she will call if needs any additional time off from work if she is not better.

## 2021-01-03 NOTE — Progress Notes (Signed)
Start time: 12:03 End time: 12:22  Virtual Visit via Video Note  I connected with Emily Mason on 01/03/21 by a video enabled telemedicine application and verified that I am speaking with the correct person using two identifiers.  Location: Patient: home Provider: office   I discussed the limitations of evaluation and management by telemedicine and the availability of in person appointments. The patient expressed understanding and agreed to proceed.  History of Present Illness:  Chief Complaint  Patient presents with  . Generalized Body Aches    VIRTUAL positive covid exposure Monday, now having body aches, chills, fever, ST and cough. Also has sinus congestion. Has not taken any home tests.    +COVID exposure.  She took her sister to get tested on Monday (masked), got results that night.  She had been with her Sunday, unmasked.  Last night she started with body aches, fatigue, scratchy, sore throat and was coughing.  She also had diarrhea yesterday and a bad "migraine".  Started with fever/chills today.  Persistent headache. Denies chest tightness, dyspnea. +runny nose, clear mucus, +sinus pain between eyes   H/o COVID12/2021 (or Jan 2022) Has albuterol inhaler at home (from that illness, still has left).   Never filled prior cough med (tussionex; pharmacy didn't have)  PMH, PSH, SH reviewed  Outpatient Encounter Medications as of 01/03/2021  Medication Sig Note  . acetaminophen (TYLENOL) 500 MG tablet Take 1,000 mg by mouth every 6 (six) hours as needed. 01/03/2021: Last dose 9:40pm  . Dextromethorphan HBr (DELSYM PO) Take 15 mLs by mouth as needed. 01/03/2021: Last dose 9:40pm  . Levonorgestrel (MIRENA, 52 MG, IU) by Intrauterine route.   Marland Kitchen albuterol (VENTOLIN HFA) 108 (90 Base) MCG/ACT inhaler Inhale 2 puffs into the lungs every 6 (six) hours as needed for wheezing or shortness of breath. (Patient not taking: Reported on 01/03/2021)   . [DISCONTINUED]  chlorpheniramine-HYDROcodone (TUSSIONEX PENNKINETIC ER) 10-8 MG/5ML SUER Take 5 mLs by mouth 2 (two) times daily.    No facility-administered encounter medications on file as of 01/03/2021.   No Known Allergies  ROS:  Per HPI--+headache, diarrhea, URI symptoms, fever, myalgias. No rashes.   Observations/Objective:  Temp (!) 101.4 F (38.6 C) (Temporal)   Ht 5\' 4"  (1.626 m)   Wt 197 lb (89.4 kg)   LMP 12/25/2020 (Exact Date)   Breastfeeding No   BMI 33.81 kg/m   Pleasant female, wearing mask in video as her young children were around her. She sounds congestion, occasional cough. She is speaking easily, in no distress She is alert and oriented. Exam is limited due to virtual nature of the visit.  +rapid COVID test Negative Influenza A&B   Assessment and Plan:  COVID-19 virus infection   Mucinex  12 hour twice daily Continue Delsym syrup twice daily as needed. Discussed sudafed, sinus rinses, albuterol prn, staying well hydrated.  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 May return on 5/31 if improving. To contact 6/31 for extension of excuse if not improved.  Pharmacy [facility]  Follow Up Instructions:    I discussed the assessment and treatment plan with the patient. The patient was provided an opportunity to ask questions and all were answered. The patient agreed with the plan and demonstrated an understanding of the instructions.   The patient was advised to call back or seek an in-person evaluation if the symptoms worsen or if the condition fails to improve as anticipated.  I spent 23 minutes dedicated to the care of this  patient, including pre-visit review of records, face to face time, post-visit ordering of testing and documentation.   Lavonda Jumbo, MD

## 2021-01-04 NOTE — Patient Instructions (Addendum)
Use tylenol and/or ibuprofen as need to help keep fever down, and for pain (body aches, headache).  Stay well hydrated--drink plenty of water.  Use Mucinex (plain, not D or DM), 12 hour tablet twice daily, and continue to use Delsym every 12 hours as needed for cough. You may get a separate decongestant (ie sudafed) to use if needed for sinus pain and congestion. You can also do sinus rinses for any sinus pain. Use the albuterol inhaler you have if needed for any shortness of breath or wheezing.  Seek care if you have shortness of breath, chest pain, feeling faint/weak, persistent high fevers, or other new concerns.  I cannot recall if you said you had COVID vaccines or not (we do not have any documented in your chart).  If you haven't gotten them, consider getting the vaccine (wait a month after this illness).  If you have only had 2 doses, be sure to get a booster.  I hope you feel better soon!  If you aren't feeling better by 5/31 (still having fever, still having significant coughing), then you should isolate for the full 10 days, not just 5, and we can write you a new work note.  Remember that if you are able to go back to work on 5/31, that you need to wear your mask all the time for days 6-10 as we discussed.

## 2021-01-08 NOTE — Progress Notes (Signed)
Letter was written, closing chart

## 2021-02-21 ENCOUNTER — Ambulatory Visit (INDEPENDENT_AMBULATORY_CARE_PROVIDER_SITE_OTHER): Payer: BLUE CROSS/BLUE SHIELD | Admitting: Family Medicine

## 2021-02-21 ENCOUNTER — Other Ambulatory Visit: Payer: Self-pay

## 2021-02-21 ENCOUNTER — Encounter: Payer: Self-pay | Admitting: Family Medicine

## 2021-02-21 VITALS — BP 124/74 | HR 60 | Ht 64.0 in | Wt 197.4 lb

## 2021-02-21 DIAGNOSIS — M25562 Pain in left knee: Secondary | ICD-10-CM | POA: Diagnosis not present

## 2021-02-21 IMAGING — CR DG CHEST 2V
2 series · 2 of 2 positions shown · non-contrast
Comparison: August 17, 2010.

CLINICAL DATA: Cough with COVID positivity.

EXAM:
CHEST - 2 VIEW

[w chest pa]
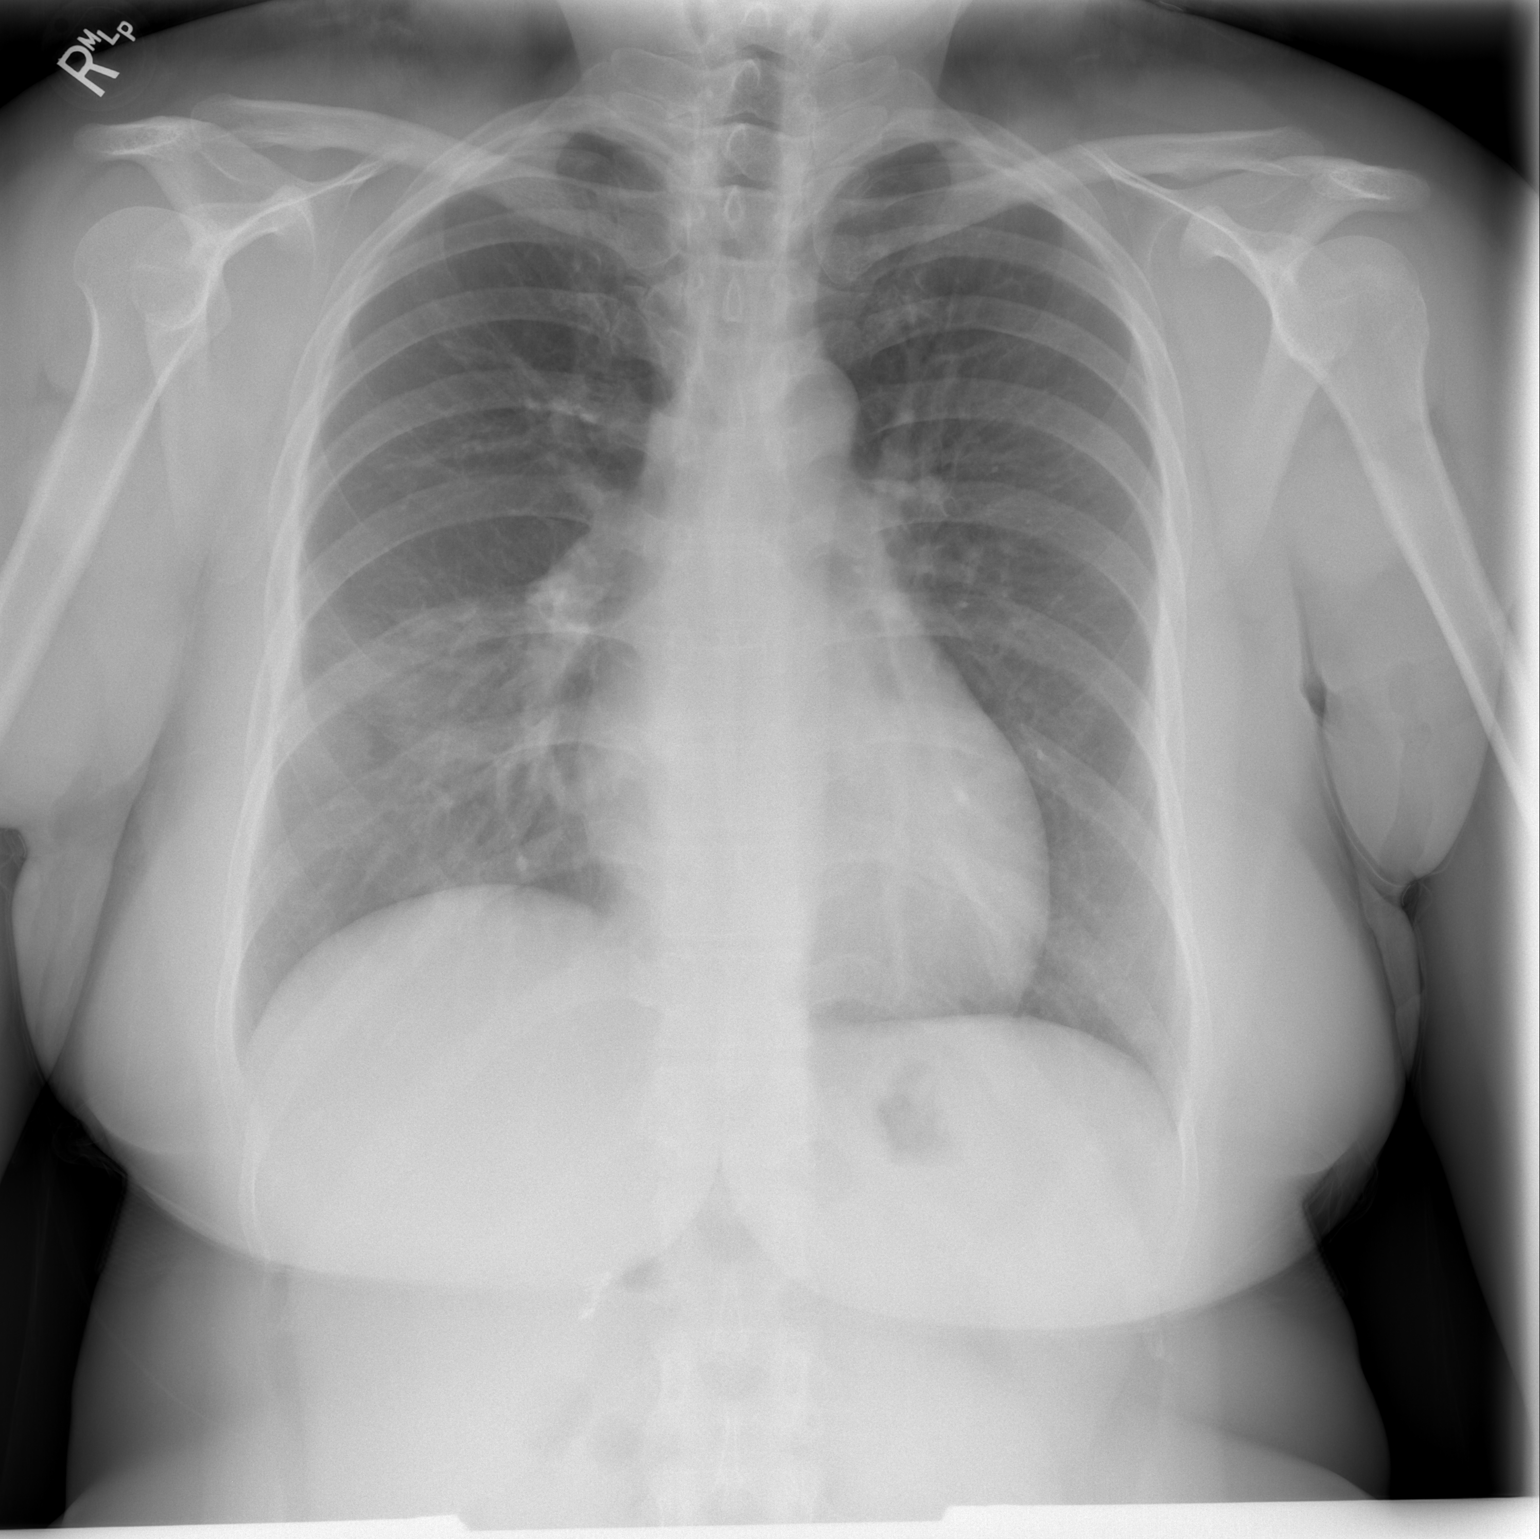

[w chest lat]
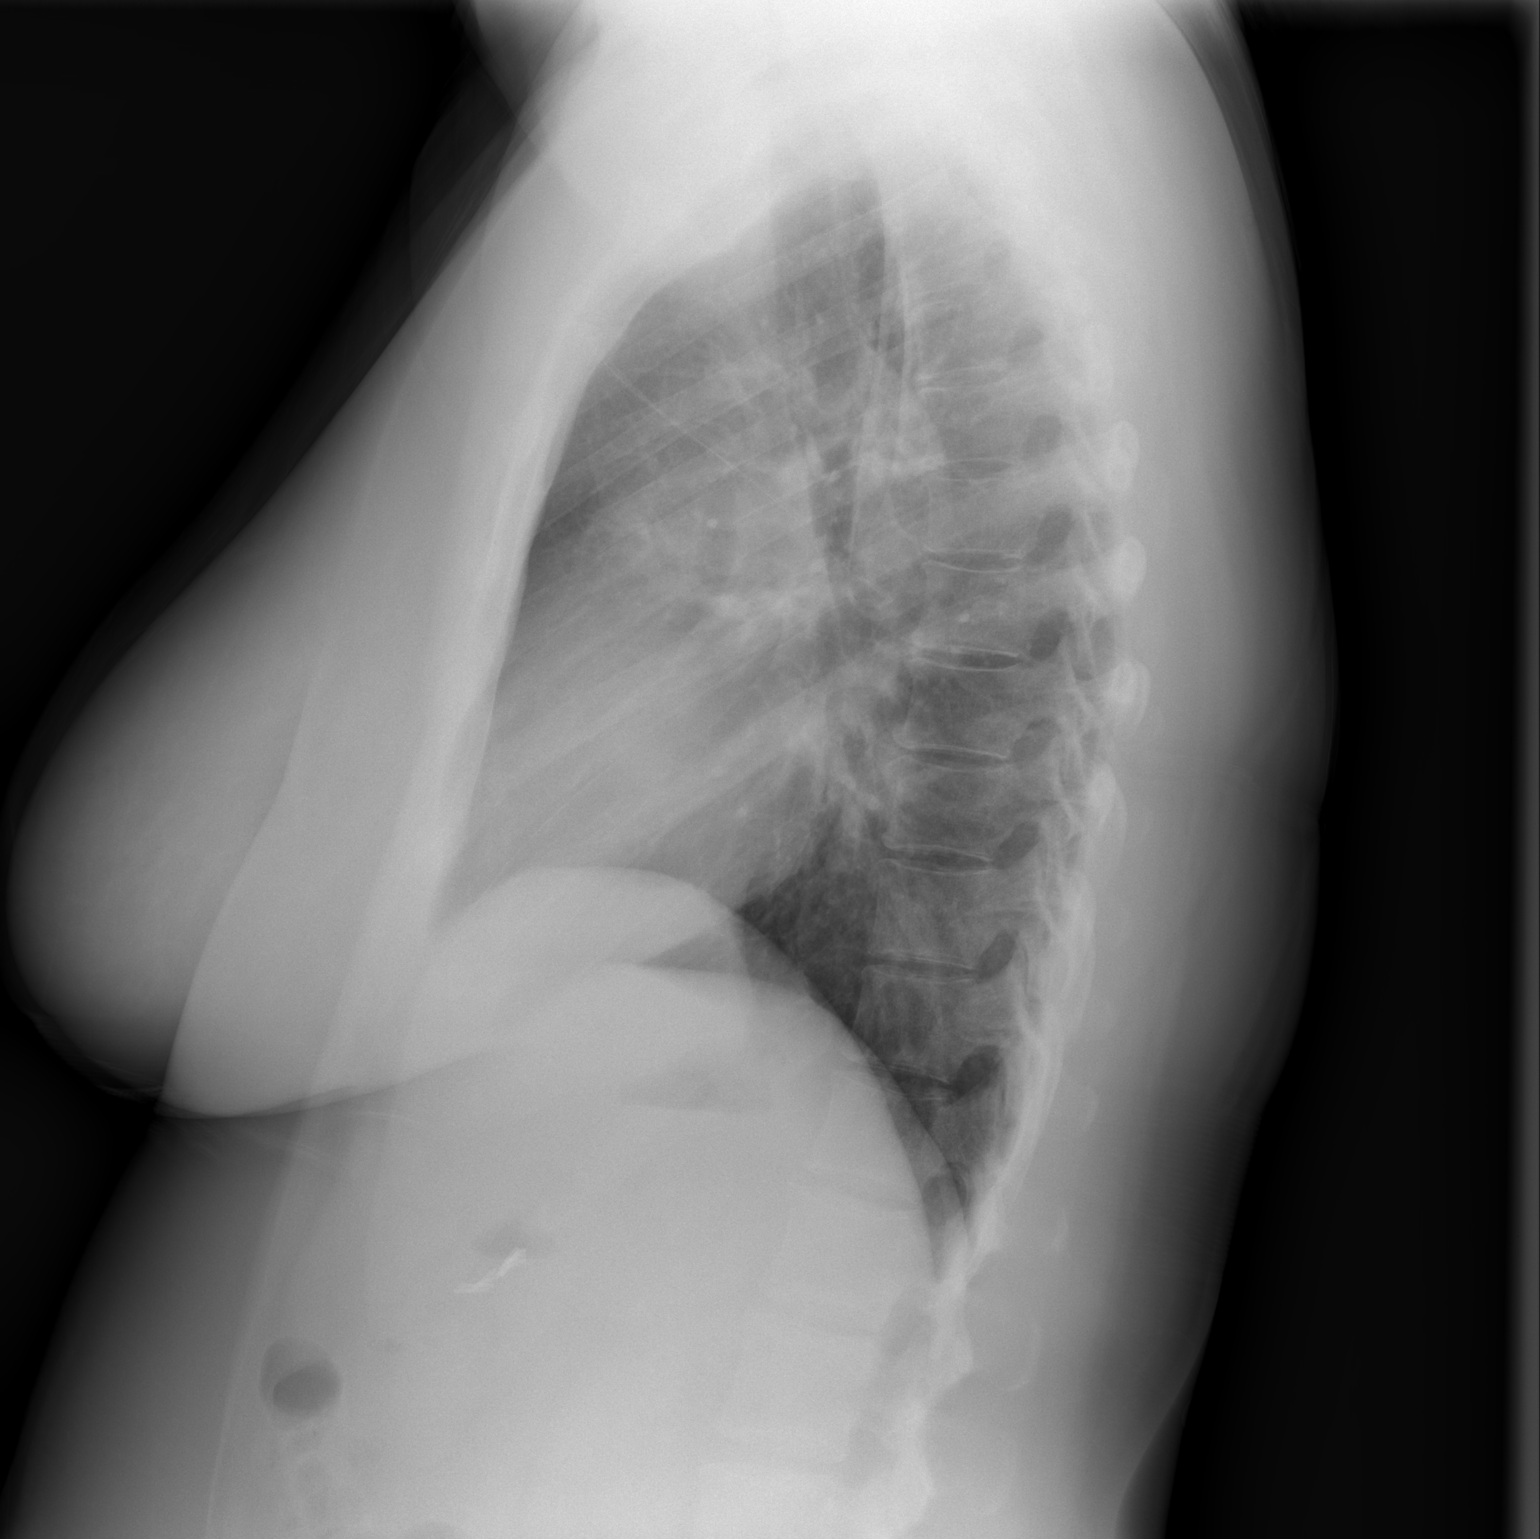

[2 of 2 positions shown; findings below may reference images not displayed]

FINDINGS: New hazy opacities in the right perihilar region. No visible pleural
effusions or pneumothorax. Cardiomediastinal silhouette is within
normal limits and similar to prior. Right upper quadrant clips.
IMPRESSION: Hazy opacities in the right perihilar region, concerning for
pneumonia given reported COVID positivity.

## 2021-02-21 MED ORDER — NAPROXEN 500 MG PO TABS: 500.0000 mg | ORAL_TABLET | Freq: Two times a day (BID) | ORAL | 0 refills | Status: DC

## 2021-02-21 NOTE — Progress Notes (Signed)
Chief Complaint  Patient presents with   Knee Pain    Left knee pain x 1 week. Last Sunday she was walking to a 5K she was running and there was a dip in the concrete, thinks she did something. Tried ice and ibuprofen and helped some but still having pain.    Note    Patient needs a note for work for today-going back to work.     While walking to the [employer] run on Sunday, felt like her L knee shifted out of place. Thinks she may have stepped on uneven concrete. Didn't see kneecap move out of place, didn't feel a pop. She opted to walk the course (5K) instead of running it.  She had no pain when walking flat, but noticed discomfort on the hills. Never noticed any knee swelling. The pain is located at the inferolateral knee. 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 She took 400mg  of ibuprofen on Sunday only.  Denies any locking or giving way of the knee.  PMH, PSH, SH reviewed  Outpatient Encounter Medications as of 02/21/2021  Medication Sig Note   Levonorgestrel (MIRENA, 52 MG, IU) by Intrauterine route.    albuterol (VENTOLIN HFA) 108 (90 Base) MCG/ACT inhaler Inhale 2 puffs into the lungs every 6 (six) hours as needed for wheezing or shortness of breath. (Patient not taking: No sig reported)    [DISCONTINUED] acetaminophen (TYLENOL) 500 MG tablet Take 1,000 mg by mouth every 6 (six) hours as needed. 01/03/2021: Last dose 9:40pm   [DISCONTINUED] Dextromethorphan HBr (DELSYM PO) Take 15 mLs by mouth as needed. 01/03/2021: Last dose 9:40pm   No facility-administered encounter medications on file as of 02/21/2021.   No Known Allergies  ROS: No f/c/n/v/d, bleeding, bruising, rashes Denies abdominal pain or other concerns.  See HPI.   PHYSICAL EXAM:  BP 124/74   Pulse 60   Ht 5\' 4"  (1.626 m)   Wt 197 lb 6.4 oz (89.5 kg)   LMP 02/15/2021 (Exact Date)   BMI 33.88 kg/m    Well-appearing, pleasant, overweight female in no distress There is some mild crepitus over the kneecap. No swelling, effusion or warmth.  No joint line tenderness. Negative Lachman and McMurray. No pain with varus/valgus stress No pain with IT band stretch or palpation of IT band.  Heart: regular rate and rhythm Lungs: clear bilaterally Abdomen: soft, notender Neuro: alert and oriented, normal strength, gait Psych: normal mood, affect, hygiene and grooming   ASSESSMENT/PLAN:  Acute pain of left knee - suspect mild strain. Normal exam today. Compresion sleeve when working, NSAID x 1 wk (longer prn), f/u if persists/worsens - Plan: naproxen (NAPROSYN) 500 MG tablet  Stop Advil. Don't take any other pain medications other than tylenol  Take the naproxen twice daily with food until the pain resolves (likely only needed for a week, but can take for the full 15 if needed.) Wear the sleeve when working. Ice the knee when you get home if it feels sore.

## 2021-02-21 NOTE — Patient Instructions (Signed)
  Stop Advil. Don't take any other pain medications other than tylenol  Take the naproxen twice daily with food until the pain resolves (likely only needed for a week, but can take for the full 15 if needed.) Wear the sleeve when working. Ice the knee when you get home if it feels sore.

## 2021-07-01 ENCOUNTER — Encounter: Payer: Self-pay | Admitting: Internal Medicine

## 2021-07-12 ENCOUNTER — Encounter: Payer: 59 | Admitting: Family Medicine

## 2021-12-11 ENCOUNTER — Ambulatory Visit: Payer: BLUE CROSS/BLUE SHIELD | Admitting: Family Medicine

## 2022-01-05 ENCOUNTER — Emergency Department (HOSPITAL_BASED_OUTPATIENT_CLINIC_OR_DEPARTMENT_OTHER)
Admission: EM | Admit: 2022-01-05 | Discharge: 2022-01-06 | Disposition: A | Discharge status: Home / Self Care | Payer: BLUE CROSS/BLUE SHIELD | Attending: Emergency Medicine | Admitting: Emergency Medicine

## 2022-01-05 ENCOUNTER — Other Ambulatory Visit: Payer: Self-pay

## 2022-01-05 ENCOUNTER — Emergency Department (HOSPITAL_BASED_OUTPATIENT_CLINIC_OR_DEPARTMENT_OTHER): Payer: BLUE CROSS/BLUE SHIELD

## 2022-01-05 ENCOUNTER — Encounter (HOSPITAL_BASED_OUTPATIENT_CLINIC_OR_DEPARTMENT_OTHER): Payer: Self-pay

## 2022-01-05 DIAGNOSIS — R0789 Other chest pain: Secondary | ICD-10-CM | POA: Diagnosis not present

## 2022-01-05 DIAGNOSIS — R0602 Shortness of breath: Secondary | ICD-10-CM | POA: Insufficient documentation

## 2022-01-05 DIAGNOSIS — Z8616 Personal history of COVID-19: Secondary | ICD-10-CM | POA: Insufficient documentation

## 2022-01-05 DIAGNOSIS — Z20822 Contact with and (suspected) exposure to covid-19: Secondary | ICD-10-CM | POA: Diagnosis not present

## 2022-01-05 LAB — CBC WITH DIFFERENTIAL/PLATELET
Abs Immature Granulocytes: 0.02 10*3/uL (ref 0.00–0.07)
Basophils Absolute: 0 10*3/uL (ref 0.0–0.1)
Basophils Relative: 0 %
Eosinophils Absolute: 0.1 10*3/uL (ref 0.0–0.5)
Eosinophils Relative: 1 %
HCT: 38.8 % (ref 36.0–46.0)
Hemoglobin: 12.7 g/dL (ref 12.0–15.0)
Immature Granulocytes: 0 %
Lymphocytes Relative: 32 %
Lymphs Abs: 2.3 10*3/uL (ref 0.7–4.0)
MCH: 24.8 pg — ABNORMAL LOW (ref 26.0–34.0)
MCHC: 32.7 g/dL (ref 30.0–36.0)
MCV: 75.6 fL — ABNORMAL LOW (ref 80.0–100.0)
Monocytes Absolute: 0.5 10*3/uL (ref 0.1–1.0)
Monocytes Relative: 7 %
Neutro Abs: 4.3 10*3/uL (ref 1.7–7.7)
Neutrophils Relative %: 60 %
Platelets: 245 10*3/uL (ref 150–400)
RBC: 5.13 MIL/uL — ABNORMAL HIGH (ref 3.87–5.11)
RDW: 14.1 % (ref 11.5–15.5)
WBC: 7.2 10*3/uL (ref 4.0–10.5)
nRBC: 0 % (ref 0.0–0.2)

## 2022-01-05 LAB — COMPREHENSIVE METABOLIC PANEL
ALT: 25 U/L (ref 0–44)
AST: 17 U/L (ref 15–41)
Albumin: 4.2 g/dL (ref 3.5–5.0)
Alkaline Phosphatase: 81 U/L (ref 38–126)
Anion gap: 10 (ref 5–15)
BUN: 20 mg/dL (ref 6–20)
CO2: 25 mmol/L (ref 22–32)
Calcium: 9.1 mg/dL (ref 8.9–10.3)
Chloride: 106 mmol/L (ref 98–111)
Creatinine, Ser: 0.66 mg/dL (ref 0.44–1.00)
GFR, Estimated: >60 mL/min (ref >60)
Glucose, Bld: 122 mg/dL — ABNORMAL HIGH (ref 70–99)
Potassium: 3.5 mmol/L (ref 3.5–5.1)
Sodium: 141 mmol/L (ref 135–145)
Total Bilirubin: 0.3 mg/dL (ref 0.3–1.2)
Total Protein: 8.1 g/dL (ref 6.5–8.1)

## 2022-01-05 LAB — RESP PANEL BY RT-PCR (FLU A&B, COVID) ARPGX2
Influenza A by PCR: NEGATIVE
Influenza B by PCR: NEGATIVE
SARS Coronavirus 2 by RT PCR: NEGATIVE

## 2022-01-05 LAB — HCG, QUANTITATIVE, PREGNANCY: hCG, Beta Chain, Quant, S: <1 m[IU]/mL (ref <5)

## 2022-01-05 LAB — BRAIN NATRIURETIC PEPTIDE: B Natriuretic Peptide: 11.4 pg/mL (ref 0.0–100.0)

## 2022-01-05 LAB — TROPONIN I (HIGH SENSITIVITY): Troponin I (High Sensitivity): <2 ng/L (ref <18)

## 2022-01-05 MED ORDER — IOHEXOL 350 MG/ML SOLN
75.0000 mL | Freq: Once | INTRAVENOUS | Status: AC | PRN
  Administered 2022-01-05: 61 mL via INTRAVENOUS

## 2022-01-05 NOTE — ED Provider Notes (Signed)
[facility] EMERGENCY DEPT Provider Note   CSN: [id number] Arrival date & time: 01/05/22  2020     History  Chief Complaint  Patient presents with   Shortness of Breath    Emily Mason is a 36 y.o. female.   Shortness of Breath  36 year old female presenting to the emergency department with shortness of breath and chest pressure that has been ongoing for the last 4 days.  The patient states that she has had occasional shortness of breath since she had COVID 1 year ago.  She states that she initially woke up from sleep gasping for air and has had trouble lying flat.  She has had some nasal congestion.  She denies any cough, fever or chills.  She endorses persistent shortness of breath and sensation of discomfort with difficulty lying flat.  She denies any lower extremity swelling.  She denies any history of PE or DVT.  She denies any recent long travel.  She has been taking Zyrtec for allergy relief.  She states that she has never had an echocardiogram to evaluate her cardiac function.  Home Medications Prior to Admission medications   Medication Sig Start Date End Date Taking? Authorizing Provider  albuterol (VENTOLIN HFA) 108 (90 Base) MCG/ACT inhaler Inhale 2 puffs into the lungs every 6 (six) hours as needed for wheezing or shortness of breath. Patient not taking: No sig reported 08/14/20   Tysinger, Kermit Balo, PA-C  Levonorgestrel (MIRENA, 52 MG, IU) by Intrauterine route.    [provider]  naproxen (NAPROSYN) 500 MG tablet Take 1 tablet (500 mg total) by mouth 2 (two) times daily with a meal. Stop when pain resolves 02/21/21   Joselyn Arrow, MD      Allergies    Patient has no known allergies.    Review of Systems   Review of Systems  Respiratory:  Positive for chest tightness and shortness of breath.   All other systems reviewed and are negative.  Physical Exam Updated Vital Signs BP 118/88 (BP Location: Right Arm)   Pulse 84   Temp 98.3 F (36.8  C) (Oral)   Resp 16   Ht 5\' 4"  (1.626 m)   Wt 86.2 kg   LMP 12/25/2021 (Exact Date)   SpO2 100%   BMI 32.61 kg/m  Physical Exam Vitals and nursing note reviewed.  Constitutional:      General: She is not in acute distress.    Appearance: She is well-developed. She is obese.  HENT:     Head: Normocephalic and atraumatic.  Eyes:     Conjunctiva/sclera: Conjunctivae normal.  Neck:     Vascular: No JVD.  Cardiovascular:     Rate and Rhythm: Normal rate and regular rhythm.     Heart sounds: No murmur heard. Pulmonary:     Effort: Pulmonary effort is normal. No tachypnea or respiratory distress.     Breath sounds: Normal breath sounds.  Abdominal:     Palpations: Abdomen is soft.     Tenderness: There is no abdominal tenderness.  Musculoskeletal:        General: No swelling.     Cervical back: Neck supple.     Right lower leg: No edema.     Left lower leg: No edema.  Skin:    General: Skin is warm and dry.     Capillary Refill: Capillary refill takes less than 2 seconds.  Neurological:     Mental Status: She is alert.  Psychiatric:  Mood and Affect: Mood normal.    ED Results / Procedures / Treatments   Labs (all labs ordered are listed, but only abnormal results are displayed) Labs Reviewed  COMPREHENSIVE METABOLIC PANEL - Abnormal; Notable for the following components:      Result Value   Glucose, Bld 122 (*)    All other components within normal limits  CBC WITH DIFFERENTIAL/PLATELET - Abnormal; Notable for the following components:   RBC 5.13 (*)    MCV 75.6 (*)    MCH 24.8 (*)    All other components within normal limits  RESP PANEL BY RT-PCR (FLU A&B, COVID) ARPGX2  BRAIN NATRIURETIC PEPTIDE  HCG, QUANTITATIVE, PREGNANCY  TROPONIN I (HIGH SENSITIVITY)  TROPONIN I (HIGH SENSITIVITY)    EKG EKG Interpretation  Date/Time:  Sunday Jan 05 2022 20:32:08 EDT Ventricular Rate:  89 PR Interval:  148 QRS Duration: 72 QT Interval:  360 QTC  Calculation: 438 R Axis:   58 Text Interpretation: Normal sinus rhythm Normal ECG No previous ECGs available Confirmed by Ernie Avena ([id number]) on 01/05/2022 9:40:42 PM  Radiology CT Angio Chest PE W and/or Wo Contrast  Result Date: 01/05/2022 CLINICAL DATA:  Shortness of breath and chest pressure. EXAM: CT ANGIOGRAPHY CHEST WITH CONTRAST TECHNIQUE: Multidetector CT imaging of the chest was performed using the standard protocol during bolus administration of intravenous contrast. Multiplanar CT image reconstructions and MIPs were obtained to evaluate the vascular anatomy. RADIATION DOSE REDUCTION: This exam was performed according to the departmental dose-optimization program which includes automated exposure control, adjustment of the mA and/or kV according to patient size and/or use of iterative reconstruction technique. CONTRAST:  61mL OMNIPAQUE IOHEXOL 350 MG/ML SOLN COMPARISON:  None Available. FINDINGS: Cardiovascular: Satisfactory opacification of the pulmonary arteries to the segmental level. No evidence of pulmonary embolism. Normal heart size. No pericardial effusion. Mediastinum/Nodes: No enlarged mediastinal, hilar, or axillary lymph nodes. Thyroid gland, trachea, and esophagus demonstrate no significant findings. Lungs/Pleura: Lungs are clear. No pleural effusion or pneumothorax. Upper Abdomen: Cholecystectomy clips are present. Musculoskeletal: No chest wall abnormality. No acute or significant osseous findings. Review of the MIP images confirms the above findings. IMPRESSION: 1. No evidence for pulmonary embolism. No acute cardiopulmonary process. Electronically Signed   By: Darliss Cheney M.D.   On: 01/05/2022 23:11    Procedures Procedures    Medications Ordered in ED Medications  iohexol (OMNIPAQUE) 350 MG/ML injection 75 mL (61 mLs Intravenous Contrast Given 01/05/22 2233)    ED Course/ Medical Decision Making/ A&P                           Medical Decision Making Amount and/or  Complexity of Data Reviewed Labs: ordered. Radiology: ordered.  Risk Prescription drug management.   36 year old female presenting to the emergency department with shortness of breath and chest pressure that has been ongoing for the last 4 days.  The patient states that she has had occasional shortness of breath since she had COVID 1 year ago.  She states that she initially woke up from sleep gasping for air and has had trouble lying flat.  She has had some nasal congestion.  She denies any cough, fever or chills.  She endorses persistent shortness of breath and sensation of discomfort with difficulty lying flat.  She denies any lower extremity swelling.  She denies any history of PE or DVT.  She denies any recent long travel.  She has been taking Zyrtec for allergy  relief.  She states that she has never had an echocardiogram to evaluate her cardiac function.  On arrival, the patient was afebrile, hemodynamically stable, not tachycardic or tachypneic, mildly hypertensive BP 163/83, saturating 100% on room air.  Normal sinus rhythm noted on cardiac telemetry.  EKG significant for normal sinus rhythm, ventricular rate 89, no significant abnormal intervals, no acute ST-T changes noted.  Differential diagnosis includes viral URI, COVID-19 or influenza infection, PE, less likely ACS, pulmonary hypertension, seasonal allergies, anxiety.  Laboratory work-up significant for troponins x2 negative, hCG normal, COVID-19 influenza PCR testing negative, BNP unremarkable, CMP without Electra abnormality, normal renal and liver function, CBC without leukocytosis or anemia.  CT head angiogram performed to evaluate for PE and/or other acute intrathoracic abnormality negative for acute abnormalities.  IMPRESSION:  1. No evidence for pulmonary embolism. No acute cardiopulmonary  process.   The patient is not hypoxic or tachypneic and overall well-appearing.  She has been endorsing some chest pressure this week  which could be due to anxiety.  She has not had previous provocative testing to evaluate for potential ischemia.  She is low risk from a cardiac perspective and does not require admission for provocative testing.  I did recommend the patient follow-up with her PCP to discuss her symptoms and to consider outpatient stress testing.  Overall stable for discharge at this time.  DC Instructions: Your overall work-up was reassuring.  Your EKG was reassuring and without evidence of acute ischemia.  Your cardiac enzymes were normal.  Your remaining laboratory work-up was also reassuring.  Your CT angiogram was negative for acute blood clot or other acute abnormality in your chest.  You have no evidence of anemia on laboratory work-up.  Recommend you follow-up with your PCP given your episodes of chest pressure for consideration for cardiac stress testing which can restratify you for an adverse cardiac event.  Final Clinical Impression(s) / ED Diagnoses Final diagnoses:  Chest pressure  Shortness of breath    Rx / DC Orders ED Discharge Orders     None         Ernie AvenaLawsing, Shephanie Romas, MD 01/07/22 0001

## 2022-01-05 NOTE — ED Triage Notes (Addendum)
Reports shortness of breath and mild chest pressure beginning 4 days ago. Sts initially she woke up from sleep gasping for air. Has had nasal congestion.   No distress noted.   Pt sts mild shob ever since having covid last year. Recently started taking zyrtec for allergy relief.

## 2022-01-05 NOTE — ED Notes (Signed)
RT assessed pt in triage for SOB. Pt Bilat BS clear throughout all fields w/no distress noted at this time. Pt states she is very congested at this time. Pts respiratory status is stable at this time w/sats of 100%, RR 20, no cough noted. RT will continue to monitor.

## 2022-01-06 LAB — TROPONIN I (HIGH SENSITIVITY): Troponin I (High Sensitivity): <2 ng/L (ref <18)

## 2022-01-06 NOTE — Discharge Instructions (Addendum)
Your overall work-up was reassuring.  Your EKG was reassuring and without evidence of acute ischemia.  Your cardiac enzymes were normal.  Your remaining laboratory work-up was also reassuring.  Your CT angiogram was negative for acute blood clot or other acute abnormality in your chest.  You have no evidence of anemia on laboratory work-up.  Recommend you follow-up with your PCP given your episodes of chest pressure for consideration for cardiac stress testing which can restratify you for an adverse cardiac event.

## 2022-01-07 ENCOUNTER — Ambulatory Visit: Payer: BLUE CROSS/BLUE SHIELD | Admitting: Physician Assistant

## 2022-01-07 ENCOUNTER — Other Ambulatory Visit: Payer: Self-pay | Admitting: Physician Assistant

## 2022-01-07 ENCOUNTER — Encounter: Payer: Self-pay | Admitting: Physician Assistant

## 2022-01-07 VITALS — BP 110/70 | HR 78 | Ht 64.0 in | Wt 210.8 lb

## 2022-01-07 DIAGNOSIS — R799 Abnormal finding of blood chemistry, unspecified: Secondary | ICD-10-CM

## 2022-01-07 DIAGNOSIS — K30 Functional dyspepsia: Secondary | ICD-10-CM | POA: Diagnosis not present

## 2022-01-07 DIAGNOSIS — R0789 Other chest pain: Secondary | ICD-10-CM

## 2022-01-07 DIAGNOSIS — R002 Palpitations: Secondary | ICD-10-CM

## 2022-01-07 DIAGNOSIS — R140 Abdominal distension (gaseous): Secondary | ICD-10-CM | POA: Diagnosis not present

## 2022-01-07 MED ORDER — ALBUTEROL SULFATE HFA 108 (90 BASE) MCG/ACT IN AERS: 2.0000 | INHALATION_SPRAY | Freq: Four times a day (QID) | RESPIRATORY_TRACT | 0 refills | Status: DC | PRN

## 2022-01-07 NOTE — Patient Instructions (Addendum)
You will get a call to schedule an appointment with Cardiology  You can take OTC Prilosec or Nexium (omeprazole) or Pepcid (famotidine) to help with heatburn and indigestion as needed;  you can also take OTC antacids and OTC antigas capsules as needed; avoid stomach irritants like tomatoes, oranges, lemons, limes, spicy foods, greasy foods, alcohol, tobacco products.   To improve digestion of food and/or ease stomach discomfort, you can take any OTC probiotic and any OTC digestive enzyme. You can go to a store with a pharmacy and the staff can show you where these items are on the shelf.

## 2022-01-07 NOTE — Progress Notes (Signed)
Established Patient Office Visit  Subjective:  Patient ID: Emily Mason, female    DOB: 06-12-1986  Age: 36 y.o. MRN: [id number]  CC:  Chief Complaint  Patient presents with   Follow-up    Pt is here for follow up from recent ER visit for pain pressure in her chest. She is also having trouble sleeping.    HPI Emily Mason presents for follow up from ED visit on 01/05/2022 for shortness of breath and chest pressure that had been ongoing for 4 days; in the ED she had a stable EKG, negative troponins x 2, CT angiogram performed to evaluate for PE and/or other acute intrathoracic abnormality negative for acute abnormalities revealed no evidence for pulmonary embolism, no acute cardiopulmonary process and advised to follow up with PCP for consideration for cardiac stress testing; states she is s/p cholecystectomy 2017 and sometimes has normal (sometimes softer) stools, but denies diarrhea; reports that she has been having indigestion, heartburn, and palpitations along with her shortness of breath and chest pressure but doesn't take any OTC medicines for acid reflux; admits that she has mild stress due to her work with the [employer]; states she has been using the albuterol inhaler as needed for shortness of breath as needed, states she has never been diagnosed with asthma, but did have wheezing when she was previously diagnosed with COVID and ever since then uses the inhaler as needed   Outpatient Medications Prior to Visit  Medication Sig Dispense Refill   cetirizine (ZYRTEC ALLERGY) 10 MG tablet Take 10 mg by mouth daily.     Levonorgestrel (MIRENA, 52 MG, IU) by Intrauterine route.     albuterol (VENTOLIN HFA) 108 (90 Base) MCG/ACT inhaler Inhale 2 puffs into the lungs every 6 (six) hours as needed for wheezing or shortness of breath. 18 g 0   naproxen (NAPROSYN) 500 MG tablet Take 1 tablet (500 mg total) by mouth 2 (two) times daily with a meal. Stop when pain resolves (Patient not taking:  Reported on 01/07/2022) 30 tablet 0   No facility-administered medications prior to visit.    No Known Allergies  Patient Care Team: Lexine Baton as PCP - General (Physician Assistant)  ROS Review of Systems  Constitutional:  Negative for activity change and chills.  HENT:  Negative for congestion and voice change.   Eyes:  Negative for pain and redness.  Respiratory:  Negative for cough and wheezing.   Cardiovascular:  Positive for palpitations. Negative for chest pain and leg swelling.  Gastrointestinal:  Negative for abdominal pain, constipation, diarrhea, nausea and vomiting.  Endocrine: Negative for polyuria.  Genitourinary:  Negative for frequency.  Skin:  Negative for color change and rash.  Allergic/Immunologic: Negative for immunocompromised state.  Neurological:  Negative for dizziness and syncope.  Psychiatric/Behavioral:  Negative for agitation.      Objective:    Physical Exam Vitals and nursing note reviewed.  Constitutional:      General: She is not in acute distress.    Appearance: She is normal weight. She is not ill-appearing.  HENT:     Head: Normocephalic and atraumatic.     Right Ear: External ear normal.     Left Ear: External ear normal.  Eyes:     Extraocular Movements: Extraocular movements intact.     Conjunctiva/sclera: Conjunctivae normal.     Pupils: Pupils are equal, round, and reactive to light.  Cardiovascular:     Rate and Rhythm: Normal rate and regular  rhythm.     Pulses: Normal pulses.     Heart sounds: Normal heart sounds.  Pulmonary:     Effort: Pulmonary effort is normal.     Breath sounds: Normal breath sounds. No wheezing.  Abdominal:     General: Bowel sounds are normal.     Palpations: Abdomen is soft.  Musculoskeletal:        General: Normal range of motion.     Cervical back: Normal range of motion.  Skin:    General: Skin is warm and dry.  Neurological:     Mental Status: She is alert and oriented to  person, place, and time.  Psychiatric:        Mood and Affect: Mood normal.        Behavior: Behavior normal.    BP 110/70   Pulse 78   Wt 210 lb 12.8 oz (95.6 kg)   LMP 12/25/2021 (Exact Date)   SpO2 99%   BMI 36.18 kg/m   Wt Readings from Last 3 Encounters:  01/07/22 210 lb 12.8 oz (95.6 kg)  01/05/22 190 lb (86.2 kg)  02/21/21 197 lb 6.4 oz (89.5 kg)    Results for orders placed or performed during the hospital encounter of 01/05/22  Resp Panel by RT-PCR (Flu A&B, Covid) Anterior Nasal Swab   Specimen: Anterior Nasal Swab  Result Value Ref Range   SARS Coronavirus 2 by RT PCR NEGATIVE NEGATIVE   Influenza A by PCR NEGATIVE NEGATIVE   Influenza B by PCR NEGATIVE NEGATIVE  Comprehensive metabolic panel  Result Value Ref Range   Sodium 141 135 - 145 mmol/L   Potassium 3.5 3.5 - 5.1 mmol/L   Chloride 106 98 - 111 mmol/L   CO2 25 22 - 32 mmol/L   Glucose, Bld 122 (H) 70 - 99 mg/dL   BUN 20 6 - 20 mg/dL   Creatinine, Ser 0.86 0.44 - 1.00 mg/dL   Calcium 9.1 8.9 - 57.8 mg/dL   Total Protein 8.1 6.5 - 8.1 g/dL   Albumin 4.2 3.5 - 5.0 g/dL   AST 17 15 - 41 U/L   ALT 25 0 - 44 U/L   Alkaline Phosphatase 81 38 - 126 U/L   Total Bilirubin 0.3 0.3 - 1.2 mg/dL   GFR, Estimated >46 >96 mL/min   Anion gap 10 5 - 15  Brain natriuretic peptide  Result Value Ref Range   B Natriuretic Peptide 11.4 0.0 - 100.0 pg/mL  CBC with Differential/Platelet  Result Value Ref Range   WBC 7.2 4.0 - 10.5 K/uL   RBC 5.13 (H) 3.87 - 5.11 MIL/uL   Hemoglobin 12.7 12.0 - 15.0 g/dL   HCT 29.5 28.4 - 13.2 %   MCV 75.6 (L) 80.0 - 100.0 fL   MCH 24.8 (L) 26.0 - 34.0 pg   MCHC 32.7 30.0 - 36.0 g/dL   RDW 44.0 10.2 - 72.5 %   Platelets 245 150 - 400 K/uL   nRBC 0.0 0.0 - 0.2 %   Neutrophils Relative % 60 %   Neutro Abs 4.3 1.7 - 7.7 K/uL   Lymphocytes Relative 32 %   Lymphs Abs 2.3 0.7 - 4.0 K/uL   Monocytes Relative 7 %   Monocytes Absolute 0.5 0.1 - 1.0 K/uL   Eosinophils Relative 1 %    Eosinophils Absolute 0.1 0.0 - 0.5 K/uL   Basophils Relative 0 %   Basophils Absolute 0.0 0.0 - 0.1 K/uL   Immature Granulocytes 0 %  Abs Immature Granulocytes 0.02 0.00 - 0.07 K/uL  hCG, quantitative, pregnancy  Result Value Ref Range   hCG, Beta Chain, Quant, S <1 <5 mIU/mL  Troponin I (High Sensitivity)  Result Value Ref Range   Troponin I (High Sensitivity) <2 <18 ng/L  Troponin I (High Sensitivity)  Result Value Ref Range   Troponin I (High Sensitivity) <2 <18 ng/L     Last CBC Lab Results  Component Value Date   WBC 7.2 01/05/2022   HGB 12.7 01/05/2022   HCT 38.8 01/05/2022   MCV 75.6 (L) 01/05/2022   MCH 24.8 (L) 01/05/2022   RDW 14.1 01/05/2022   PLT 245 01/05/2022   Last metabolic panel Lab Results  Component Value Date   GLUCOSE 122 (H) 01/05/2022   NA 141 01/05/2022   K 3.5 01/05/2022   CL 106 01/05/2022   CO2 25 01/05/2022   BUN 20 01/05/2022   CREATININE 0.66 01/05/2022   GFRNONAA >60 01/05/2022   CALCIUM 9.1 01/05/2022   PROT 8.1 01/05/2022   ALBUMIN 4.2 01/05/2022   LABGLOB 3.4 07/10/2020   AGRATIO 1.3 07/10/2020   BILITOT 0.3 01/05/2022   ALKPHOS 81 01/05/2022   AST 17 01/05/2022   ALT 25 01/05/2022   ANIONGAP 10 01/05/2022   Last lipids Lab Results  Component Value Date   CHOL 158 07/10/2020   HDL 46 07/10/2020   LDLCALC 95 07/10/2020   TRIG 91 07/10/2020   CHOLHDL 3.4 07/10/2020   Last hemoglobin A1c No results found for: HGBA1C Last thyroid functions Lab Results  Component Value Date   TSH 1.640 07/10/2020   T3TOTAL 135 07/10/2020   Last vitamin D No results found for: 25OHVITD2, 25OHVITD3, VD25OH Last vitamin B12 and Folate No results found for: VITAMINB12, FOLATE    The ASCVD Risk score (Arnett DK, et al., 2019) failed to calculate for the following reasons:   The 2019 ASCVD risk score is only valid for ages 3940 to 1379    Assessment & Plan:   Problem List Items Addressed This Visit   None Visit Diagnoses     Chest  pressure    -  Primary   Relevant Orders   Ambulatory referral to Cardiology   Lipid panel   Thyroid Panel With TSH Can use albuterol inhaler as needed   Palpitations       Relevant Orders   Ambulatory referral to Cardiology   Thyroid Panel With TSH   Indigestion  - otc PPI, antacids, digestive enzymes, antigas pills, avoid stomach irritants     Bloating  - otc antigas pills     Abnormal blood chemistry       Relevant Orders   Lipid panel   Thyroid Panel With TSH       Meds ordered this encounter  Medications   albuterol (VENTOLIN HFA) 108 (90 Base) MCG/ACT inhaler    Sig: Inhale 2 puffs into the lungs every 6 (six) hours as needed for wheezing or shortness of breath.    Dispense:  18 g    Refill:  0    Order Specific Question:   Supervising Provider    Answer:   Ronnald NianLALONDE, JOHN C [[id number]    Follow-up: Return for Return as Already Scheduled.    Jake SharkLynne B Janisha Bueso, PA-C

## 2022-01-08 DIAGNOSIS — R072 Precordial pain: Secondary | ICD-10-CM | POA: Insufficient documentation

## 2022-01-08 LAB — THYROID PANEL WITH TSH
Free Thyroxine Index: 2.2 (ref 1.2–4.9)
T3 Uptake Ratio: 24 % (ref 24–39)
T4, Total: 9.1 ug/dL (ref 4.5–12.0)
TSH: 2.66 u[IU]/mL (ref 0.450–4.500)

## 2022-01-08 LAB — LIPID PANEL
Chol/HDL Ratio: 3.5 ratio (ref 0.0–4.4)
Cholesterol, Total: 140 mg/dL (ref 100–199)
HDL: 40 mg/dL (ref >39)
LDL Chol Calc (NIH): 70 mg/dL (ref 0–99)
Triglycerides: 178 mg/dL — ABNORMAL HIGH (ref 0–149)
VLDL Cholesterol Cal: 30 mg/dL (ref 5–40)

## 2022-01-08 NOTE — Progress Notes (Unsigned)
Cardiology Office Note   Date:  01/09/2022   ID:  Emily Mason, DOB [birth year], MRN [id number]  PCP:  Jake Shark, PA-C  Cardiologist:   None   No chief complaint on file.     History of Present Illness: Emily Mason is a 36 y.o. female who presents for evaluation of chest pain.  She was referred by Jake Shark, PA-C.  She was in the ED late last month for this.  I reviewed these records for this visit.   There was no evidence of ischemia.     She said that she thinks she has had some shortness of breath since 2021 when she had COVID.  She had it again in 2022.  She was given some bronchodilators to take as needed shortness of breath which she has done occasionally.  She says some days she is more short of breath than others with activity.  Some days she feels okay.  However, last week for 3 or 4 days she was having more shortness of breath mostly at night.  It got to the point where she was short of breath at rest and her family encouraged her to go to the ER.  She had some discomfort she felt like a heaviness that was 6 out of 10 in intensity.  It lasted all day.  She felt bloated.  She took eventually some over-the-counter diuretic and felt better.  In the emergency room PE was ruled out.  CT was unremarkable.  Chest x-ray and labs were unremarkable.  BNP was normal.  She is felt a little bit better since then and is not having any acute shortness of breath, PND or orthopnea.  She is not describing any new palpitations, presyncope or syncope.  She previously has been walking and running but she has not done this in 3 to 4 weeks because of planter fasciitis.   Past Medical History:  Diagnosis Date   UTI (lower urinary tract infection)     Past Surgical History:  Procedure Laterality Date   CHOLECYSTECTOMY N/A 02/21/2016   Procedure: LAPAROSCOPIC CHOLECYSTECTOMY WITH INTRAOPERATIVE CHOLANGIOGRAM;  Surgeon: Claud Kelp, MD;  Location: [facility];  Service: General;   Laterality: N/A;   DILATION AND CURETTAGE OF UTERUS N/A 11/02/2017   Procedure: DILATATION AND CURETTAGE;  Surgeon: Essie Hart, MD;  Location: [facility];  Service: Gynecology;  Laterality: N/A;   WISDOM TOOTH EXTRACTION       Current Outpatient Medications  Medication Sig Dispense Refill   albuterol (VENTOLIN HFA) 108 (90 Base) MCG/ACT inhaler Inhale 2 puffs into the lungs every 6 (six) hours as needed for wheezing or shortness of breath. 18 g 0   albuterol (VENTOLIN HFA) 108 (90 Base) MCG/ACT inhaler Inhale 2 puffs into the lungs every 6 (six) hours as needed for wheezing or shortness of breath. 18 g 0   cetirizine (ZYRTEC) 10 MG tablet Take 10 mg by mouth daily.     Levonorgestrel (MIRENA, 52 MG, IU) by Intrauterine route.     No current facility-administered medications for this visit.    Allergies:   Patient has no known allergies.    Social History:  The patient  reports that she has never smoked. She has never used smokeless tobacco. She reports current alcohol use. She reports that she does not use drugs.   Family History:  The patient's family history includes Gout in her brother.    ROS:  Please see the history of present  illness.   Otherwise, review of systems are positive for none.   All other systems are reviewed and negative.    PHYSICAL EXAM: VS:  BP 115/82   Pulse 88   Ht 5\' 4"  (1.626 m)   Wt 209 lb 6.4 oz (95 kg)   LMP 12/25/2021 (Exact Date)   SpO2 98%   BMI 35.94 kg/m  , BMI Body mass index is 35.94 kg/m. GENERAL:  Well appearing HEENT:  Pupils equal round and reactive, fundi not visualized, oral mucosa unremarkable NECK:  No jugular venous distention, waveform within normal limits, carotid upstroke brisk and symmetric, no bruits, no thyromegaly LYMPHATICS:  No cervical, inguinal adenopathy LUNGS:  Clear to auscultation bilaterally BACK:  No CVA tenderness CHEST:  Unremarkable HEART:  PMI not displaced or sustained,S1 and S2 within normal  limits, no S3, no S4, no clicks, no rubs, no murmurs ABD:  Flat, positive bowel sounds normal in frequency in pitch, no bruits, no rebound, no guarding, no midline pulsatile mass, no hepatomegaly, no splenomegaly EXT:  2 plus pulses throughout, no edema, no cyanosis no clubbing SKIN:  No rashes no nodules NEURO:  Cranial nerves II through XII grossly intact, motor grossly intact throughout PSYCH:  Cognitively intact, oriented to person place and time    EKG:  EKG is not ordered today. The ekg ordered 01/05/2022 demonstrates sinus rhythm, rate 89, axis within normal limits, intervals within normal limits, no acute ST-T wave changes   Recent Labs: 01/05/2022: ALT 25; B Natriuretic Peptide 11.4; BUN 20; Creatinine, Ser 0.66; Hemoglobin 12.7; Platelets 245; Potassium 3.5; Sodium 141 01/07/2022: TSH 2.660    Lipid Panel    Component Value Date/Time   CHOL 140 01/07/2022 1336   TRIG 178 (H) 01/07/2022 1336   HDL 40 01/07/2022 1336   CHOLHDL 3.5 01/07/2022 1336   LDLCALC 70 01/07/2022 1336      Wt Readings from Last 3 Encounters:  01/09/22 209 lb 6.4 oz (95 kg)  01/07/22 210 lb 12.8 oz (95.6 kg)  01/05/22 190 lb (86.2 kg)      Other studies Reviewed: Additional studies/ records that were reviewed today include: ED records. Review of the above records demonstrates:  Please see elsewhere in the note.     ASSESSMENT AND PLAN:  Chest pain: The patient is complaining of chest discomfort and shortness of breath.  The pretest probability of obstructive coronary disease is very low.  I do not think further cardiovascular testing is suggested.  I have suggested follow-up with her primary and possibly pulmonary given the predominant complaint of shortness of breath.  We did discuss at length primary risk reduction and suggestions for exercise and diet.   Current medicines are reviewed at length with the patient today.  The patient does not have concerns regarding medicines.  The following  changes have been made:  no change  Labs/ tests ordered today include:  No orders of the defined types were placed in this encounter.    Disposition:   FU with me as needed   Signed, 01/07/22, MD  01/09/2022 4:50 PM     [facility]

## 2022-01-09 ENCOUNTER — Encounter: Payer: Self-pay | Admitting: Cardiology

## 2022-01-09 ENCOUNTER — Ambulatory Visit: Payer: BLUE CROSS/BLUE SHIELD | Admitting: Cardiology

## 2022-01-09 VITALS — BP 115/82 | HR 88 | Ht 64.0 in | Wt 209.4 lb

## 2022-01-09 DIAGNOSIS — R072 Precordial pain: Secondary | ICD-10-CM | POA: Diagnosis not present

## 2022-01-09 NOTE — Patient Instructions (Signed)
  Follow-Up: At [facility], you and your health needs are our priority.  As part of our continuing mission to provide you with exceptional heart care, we have created designated Provider Care Teams.  These Care Teams include your primary Cardiologist (physician) and Advanced Practice Providers (APPs -  Physician Assistants and Nurse Practitioners) who all work together to provide you with the care you need, when you need it.  We recommend signing up for the patient portal called "MyChart".  Sign up information is provided on this After Visit Summary.  MyChart is used to connect with patients for Virtual Visits (Telemedicine).  Patients are able to view lab/test results, encounter notes, upcoming appointments, etc.  Non-urgent messages can be sent to your provider as well.   To learn more about what you can do with MyChart, go to [email or web address].    Your next appointment:    As needed  Important Information About Sugar       

## 2022-02-07 ENCOUNTER — Encounter: Payer: Self-pay | Admitting: Internal Medicine

## 2022-02-10 ENCOUNTER — Telehealth: Payer: Self-pay | Admitting: Internal Medicine

## 2022-02-10 NOTE — Telephone Encounter (Signed)
Pt has agreed to switch to [employer]

## 2022-02-20 ENCOUNTER — Encounter: Payer: Self-pay | Admitting: Medical

## 2022-02-20 ENCOUNTER — Other Ambulatory Visit: Payer: Self-pay

## 2022-02-20 ENCOUNTER — Ambulatory Visit (INDEPENDENT_AMBULATORY_CARE_PROVIDER_SITE_OTHER): Payer: BLUE CROSS/BLUE SHIELD | Admitting: Medical

## 2022-02-20 VITALS — BP 130/80 | HR 80 | Ht 65.0 in | Wt 211.8 lb

## 2022-02-20 DIAGNOSIS — M549 Dorsalgia, unspecified: Secondary | ICD-10-CM | POA: Insufficient documentation

## 2022-02-20 DIAGNOSIS — Z7185 Encounter for immunization safety counseling: Secondary | ICD-10-CM | POA: Diagnosis not present

## 2022-02-20 DIAGNOSIS — Z0000 Encounter for general adult medical examination without abnormal findings: Secondary | ICD-10-CM

## 2022-02-20 DIAGNOSIS — R0600 Dyspnea, unspecified: Secondary | ICD-10-CM | POA: Insufficient documentation

## 2022-02-20 DIAGNOSIS — Z23 Encounter for immunization: Secondary | ICD-10-CM | POA: Diagnosis not present

## 2022-02-20 DIAGNOSIS — Z862 Personal history of diseases of the blood and blood-forming organs and certain disorders involving the immune mechanism: Secondary | ICD-10-CM

## 2022-02-20 DIAGNOSIS — Z131 Encounter for screening for diabetes mellitus: Secondary | ICD-10-CM

## 2022-02-20 DIAGNOSIS — M62838 Other muscle spasm: Secondary | ICD-10-CM | POA: Insufficient documentation

## 2022-02-20 DIAGNOSIS — M79672 Pain in left foot: Secondary | ICD-10-CM

## 2022-02-20 DIAGNOSIS — M722 Plantar fascial fibromatosis: Secondary | ICD-10-CM | POA: Insufficient documentation

## 2022-02-20 MED ORDER — FLUTICASONE FUROATE-VILANTEROL 100-25 MCG/ACT IN AEPB: 1.0000 | INHALATION_SPRAY | Freq: Every day | RESPIRATORY_TRACT | 0 refills | Status: DC

## 2022-02-20 MED ORDER — ALBUTEROL SULFATE HFA 108 (90 BASE) MCG/ACT IN AERS: 2.0000 | INHALATION_SPRAY | Freq: Four times a day (QID) | RESPIRATORY_TRACT | 0 refills | Status: DC | PRN

## 2022-02-20 NOTE — Progress Notes (Signed)
Subjective:   HPI  Emily Mason is a 36 y.o. female who presents for Chief Complaint  Patient presents with   Annual Exam    Non fasting [facility]- [facility] does her paps she will schedule with them soon. wants referral to asthma specialist.    Patient Care Team: Laterrance Nauta, Kermit Balo, PA-C as PCP - General (Family Medicine) Sees dentist Sees eye doctor Dr. Rollene Rotunda, cardiology [facility]  Concerns: She notes that since having COVID twice once in 2021 and once in 2022 that she gets some breathing issues.  She does not recall having problems breathing prior to 2021.  No history of asthma in the past.  She has had to use inhalers on and off since 2021.  Sometimes feels a fullness like someone sitting on her chest.  She does get some allergy problems.  But no history of asthma.  She saw cardiology relatively recently in April and she did not have a cardiac issue.  Sometimes gets some acid reflux problems.  She think she has some plantar fasciitis problems of left foot.  Prior to October last year she used to run 3 to 5 miles several days per week regularly for exercise.  She started having foot pains in the left.  She gets pain in her bottom of her foot and heel on the left.  No prior eval for this.  No numbness tingling or weakness.    She has history of iron deficiency anemia.  No current concerns.  Has had iron infusions in the past.  Gynecological history: 5 pregnancies, 3 live births, 2 miscarriages.   Periods are light with Mirena.    Has Mirena since 2019, 5 year Mirena. Reviewed their medical, surgical, family, social, medication, and allergy history and updated chart as appropriate.  Past Medical History:  Diagnosis Date   History of COVID-19    2021 and 2022   UTI (lower urinary tract infection)     Family History  Problem Relation Age of Onset   Gout Brother    Asthma Neg Hx    Cancer Neg Hx    Diabetes Neg Hx    Heart disease Neg Hx     Hyperlipidemia Neg Hx    Hypertension Neg Hx    Stroke Neg Hx      Current Outpatient Medications:    cetirizine (ZYRTEC) 10 MG tablet, Take 10 mg by mouth daily., Disp: , Rfl:    fluticasone furoate-vilanterol (BREO ELLIPTA) 100-25 MCG/ACT AEPB, Inhale 1 puff into the lungs daily., Disp: 1 each, Rfl: 0   Levonorgestrel (MIRENA, 52 MG, IU), by Intrauterine route., Disp: , Rfl:    albuterol (VENTOLIN HFA) 108 (90 Base) MCG/ACT inhaler, Inhale 2 puffs into the lungs every 6 (six) hours as needed for wheezing or shortness of breath., Disp: 18 g, Rfl: 0  No Known Allergies   Review of Systems  Constitutional:  Negative for chills, fever, malaise/fatigue and weight loss.  HENT:  Negative for congestion, ear pain, hearing loss, sore throat and tinnitus.   Eyes:  Negative for blurred vision, pain and redness.  Respiratory:  Positive for shortness of breath. Negative for cough and hemoptysis.   Cardiovascular:  Positive for chest pain. Negative for palpitations, orthopnea, claudication and leg swelling.  Gastrointestinal:  Negative for abdominal pain, blood in stool, constipation, diarrhea, nausea and vomiting.  Genitourinary:  Negative for dysuria, flank pain, frequency, hematuria and urgency.  Musculoskeletal:  Positive for back pain, myalgias and neck  pain. Negative for falls and joint pain.  Skin:  Negative for itching and rash.  Neurological:  Negative for dizziness, tingling, speech change, weakness and headaches.  Endo/Heme/Allergies:  Negative for polydipsia. Does not bruise/bleed easily.  Psychiatric/Behavioral:  Negative for depression and memory loss. The patient is not nervous/anxious and does not have insomnia.          02/20/2022    3:36 PM 01/07/2022   11:15 AM 07/10/2020    1:31 PM 07/22/2016    9:41 AM  Depression screen PHQ 2/9  Decreased Interest 0 0 0 0  Down, Depressed, Hopeless 0 0 0 0  PHQ - 2 Score 0 0 0 0       Objective:  BP 130/80   Pulse 80   Ht 5\' 5"   (1.651 m)   Wt 211 lb 12.8 oz (96.1 kg)   LMP 02/16/2022   SpO2 98%   BMI 35.25 kg/m   Wt Readings from Last 3 Encounters:  02/20/22 211 lb 12.8 oz (96.1 kg)  01/09/22 209 lb 6.4 oz (95 kg)  01/07/22 210 lb 12.8 oz (95.6 kg)    General appearance: alert, no distress, WD/WN, Asian female Skin: unremarkable, tattoo left medial forearm HEENT: normocephalic, conjunctiva/corneas normal, sclerae anicteric, PERRLA, EOMi, nares patent, no discharge or erythema, pharynx normal Oral cavity: MMM, tongue normal, teeth normal Neck: supple, no lymphadenopathy, no thyromegaly, no masses, normal ROM, no bruits Chest: non tender, normal shape and expansion Heart: RRR, normal S1, S2, no murmurs Lungs: CTA bilaterally, no wheezes, rhonchi, or rales Abdomen: +bs, soft, non tender, non distended, no masses, no hepatomegaly, no splenomegaly, no bruits Back: non tender, normal ROM, no scoliosis Musculoskeletal: Decreased arches of both feet, flat feet, tenderness of left volar foot and heel in general, otherwise no swelling or deformity.  Normal range of motion of toes and ankles. upper extremities non tender, no obvious deformity, normal ROM throughout, lower extremities non tender, no obvious deformity, normal ROM throughout Extremities: no edema, no cyanosis, no clubbing Pulses: 2+ symmetric, upper and lower extremities, normal cap refill Neurological: alert, oriented x 3, CN2-12 intact, strength normal upper extremities and lower extremities, sensation normal throughout, DTRs 2+ throughout, no cerebellar signs, gait normal Psychiatric: normal affect, behavior normal, pleasant  Breast/gyn/rectal - deferred to gynecology   PFT reviewed    Assessment and Plan :   Encounter Diagnoses  Name Primary?   Encounter for health maintenance examination in adult Yes   Need for pneumococcal vaccination    Vaccine counseling    Screening for diabetes mellitus    History of anemia    Dyspnea, unspecified  type    Plantar fasciitis    Foot pain, left    Upper back pain    Muscle spasm      This visit was a preventative care visit, also known as wellness visit or routine physical.   Topics typically include healthy lifestyle, diet, exercise, preventative care, vaccinations, sick and well care, proper use of emergency dept and after hours care, as well as other concerns.     Recommendations: Continue to return yearly for your annual wellness and preventative care visits.  This gives 01/09/22 a chance to discuss healthy lifestyle, exercise, vaccinations, review your chart record, and perform screenings where appropriate.  I recommend you see your eye doctor yearly for routine vision care.  I recommend you see your dentist yearly for routine dental care including hygiene visits twice yearly.   Vaccination recommendations were reviewed Immunization  History  Administered Date(s) Administered   Pneumococcal Polysaccharide-23 02/20/2022   Tdap 03/12/2015    Counseled on the pneumococcal vaccine.  Vaccine information sheet given.  Pneumococcal vaccine PPSV23 given after consent obtained.  Advised a yearly flu shot   Screening for cancer: Colon cancer screening: Age 26  Breast cancer screening: You should perform a self breast exam monthly.   We reviewed recommendations for regular mammograms and breast cancer screening.  Cervical cancer screening: We reviewed recommendations for pap smear screening.   Skin cancer screening: Check your skin regularly for new changes, growing lesions, or other lesions of concern Come in for evaluation if you have skin lesions of concern.  Lung cancer screening: If you have a greater than 20 pack year history of tobacco use, then you may qualify for lung cancer screening with a chest CT scan.   Please call your insurance company to inquire about coverage for this test.  We currently don't have screenings for other cancers besides breast, cervical, colon,  and lung cancers.  If you have a strong family history of cancer or have other cancer screening concerns, please let me know.    Bone health: Get at least 150 minutes of aerobic exercise weekly Get weight bearing exercise at least once weekly Bone density test:  A bone density test is an imaging test that uses a type of X-ray to measure the amount of calcium and other minerals in your bones. The test may be used to diagnose or screen you for a condition that causes weak or thin bones (osteoporosis), predict your risk for a broken bone (fracture), or determine how well your osteoporosis treatment is working. The bone density test is recommended for females 22 and older, or females or males [id number] if certain risk factors such as thyroid disease, long term use of steroids such as for asthma or rheumatological issues, vitamin D deficiency, estrogen deficiency, family history of osteoporosis, self or family history of fragility fracture in first degree relative.    Heart health: Get at least 150 minutes of aerobic exercise weekly Limit alcohol It is important to maintain a healthy blood pressure and healthy cholesterol numbers  Heart disease screening: Screening for heart disease includes screening for blood pressure, fasting lipids, glucose/diabetes screening, BMI height to weight ratio, reviewed of smoking status, physical activity, and diet.    Goals include blood pressure 120/80 or less, maintaining a healthy lipid/cholesterol profile, preventing diabetes or keeping diabetes numbers under good control, not smoking or using tobacco products, exercising most days per week or at least 150 minutes per week of exercise, and eating healthy variety of fruits and vegetables, healthy oils, and avoiding unhealthy food choices like fried food, fast food, high sugar and high cholesterol foods.    Other tests may possibly include EKG test, CT coronary calcium score, echocardiogram, exercise treadmill stress  test.   I reviewed recent cardiology notes from 01/2022   Medical care options: I recommend you continue to seek care here first for routine care.  We try really hard to have available appointments Monday through Friday daytime hours for sick visits, acute visits, and physicals.  Urgent care should be used for after hours and weekends for significant issues that cannot wait till the next day.  The emergency department should be used for significant potentially life-threatening emergencies.  The emergency department is expensive, can often have long wait times for less significant concerns, so try to utilize primary care, urgent care, or telemedicine when possible to  avoid unnecessary trips to the emergency department.  Virtual visits and telemedicine have been introduced since the pandemic started in 2020, and can be convenient ways to receive medical care.  We offer virtual appointments as well to assist you in a variety of options to seek medical care.   Separate significant issues discussed: Dyspnea - likely allergic asthma or lung changes s/p covid.  Consider updated chest xray or imaging.  Begin trial of Breo inhaler.  Gave sample and demonstrated proper use.  She will continue albuterol as needed.  Continue antihistamine.  Follow-up in a month.  Discussed exam findings, symptoms, and etiology appears to be plantar fascitis and pes planus.  Discussed diagnosis, treatment recommendations, and follow up.     Plantar fascitis Every morning try doing a tennis ball massage with feet on the floor and towel stretch behind the ball of the foot to stretch the plantar fascia Order plantar fascitis night time 90 degree splints online, through [employer] for example.   They are typically about $25 each Avoid going barefoot since your feet flatten out without good arch support I recommend you use arch support shoe inserts.   This will help support the plantar fascia and arches You can use over the counter pain  reliever for worse pain F/u in a month   Upper back pain, spasm, lateral neck pain on the right-musculoskeletal in origin.  Discussed stretching, consider massage therapy, heat as needed.  Lekeya was seen today for annual exam.  Diagnoses and all orders for this visit:  Encounter for health maintenance examination in adult -     Hemoglobin A1c -     Iron, TIBC and Ferritin Panel -     Ambulatory referral to Gynecology  Need for pneumococcal vaccination  Vaccine counseling  Screening for diabetes mellitus -     Hemoglobin A1c  History of anemia -     Iron, TIBC and Ferritin Panel  Dyspnea, unspecified type  Plantar fasciitis  Foot pain, left  Upper back pain  Muscle spasm  Other orders -     albuterol (VENTOLIN HFA) 108 (90 Base) MCG/ACT inhaler; Inhale 2 puffs into the lungs every 6 (six) hours as needed for wheezing or shortness of breath. -     fluticasone furoate-vilanterol (BREO ELLIPTA) 100-25 MCG/ACT AEPB; Inhale 1 puff into the lungs daily.    Follow-up pending labs, yearly for physical

## 2022-02-21 LAB — IRON,TIBC AND FERRITIN PANEL
Ferritin: 171 ng/mL — ABNORMAL HIGH (ref 15–150)
Iron Saturation: 14 % — ABNORMAL LOW (ref 15–55)
Iron: 52 ug/dL (ref 27–159)
Total Iron Binding Capacity: 385 ug/dL (ref 250–450)
UIBC: 333 ug/dL (ref 131–425)

## 2022-02-21 LAB — HEMOGLOBIN A1C
Est. average glucose Bld gHb Est-mCnc: 134 mg/dL
Hgb A1c MFr Bld: 6.3 % — ABNORMAL HIGH (ref 4.8–5.6)

## 2022-03-27 ENCOUNTER — Ambulatory Visit: Payer: BLUE CROSS/BLUE SHIELD | Admitting: Medical

## 2022-03-27 VITALS — BP 110/80 | HR 69 | Wt 205.2 lb

## 2022-03-27 DIAGNOSIS — R7301 Impaired fasting glucose: Secondary | ICD-10-CM | POA: Diagnosis not present

## 2022-03-27 DIAGNOSIS — R790 Abnormal level of blood mineral: Secondary | ICD-10-CM | POA: Insufficient documentation

## 2022-03-27 DIAGNOSIS — J4540 Moderate persistent asthma, uncomplicated: Secondary | ICD-10-CM | POA: Diagnosis not present

## 2022-03-27 DIAGNOSIS — J45909 Unspecified asthma, uncomplicated: Secondary | ICD-10-CM | POA: Insufficient documentation

## 2022-03-27 DIAGNOSIS — F411 Generalized anxiety disorder: Secondary | ICD-10-CM | POA: Diagnosis not present

## 2022-03-27 MED ORDER — BUDESONIDE-FORMOTEROL FUMARATE 160-4.5 MCG/ACT IN AERO: 2.0000 | INHALATION_SPRAY | Freq: Two times a day (BID) | RESPIRATORY_TRACT | 3 refills | Status: DC

## 2022-03-27 MED ORDER — PROAIR RESPICLICK 108 (90 BASE) MCG/ACT IN AEPB: 2.0000 | INHALATION_SPRAY | Freq: Four times a day (QID) | RESPIRATORY_TRACT | 1 refills | Status: DC | PRN

## 2022-03-27 MED ORDER — BUPROPION HCL ER (XL) 150 MG PO TB24: 150.0000 mg | ORAL_TABLET | Freq: Every day | ORAL | 1 refills | Status: DC

## 2022-03-27 NOTE — Progress Notes (Signed)
Subjective:  Emily Mason is a 36 y.o. female who presents for Chief Complaint  Patient presents with   follow-up on asthma    Follow-up on asthma medication. Inhaler is messing up again and very weak coming out.      Here for follow-up from recent visit.  At her last visit we began a trial of Breo inhaler for suspected allergic asthma.  She does see improvement with this along with Zyrtec and rescue inhaler albuterol.  However she has had some problems with the albuterol device.  It does not seem to be very strong in terms of expelling the medication.  She is called her pharmacy about it and they gave her a different device but it still does not seem to help.  She wants to try different inhaler.  At last visit her labs showed normal iron but iron saturation was low.  She denies any blood in the stool and no heavy periods.  Last visit she had impaired glucose findings and she has made dietary changes and has lost some weight since last visit.  She has concerns about anxiety.  She has a lot of different stressors including trying to sell her house, work stress, sleep issues, and her ex-husband and her still working through some things which is stressful.  She would like to try some medication to help with her anxious feelings.  She seems to have stressful moments every day and is having trouble dealing with this.  She has not started any counseling.  No other aggravating or relieving factors.    No other c/o.  Past Medical History:  Diagnosis Date   History of COVID-19    2021 and 2022   UTI (lower urinary tract infection)    Current Outpatient Medications on File Prior to Visit  Medication Sig Dispense Refill   albuterol (VENTOLIN HFA) 108 (90 Base) MCG/ACT inhaler Inhale 2 puffs into the lungs every 6 (six) hours as needed for wheezing or shortness of breath. 18 g 0   cetirizine (ZYRTEC) 10 MG tablet Take 10 mg by mouth daily.     Levonorgestrel (MIRENA, 52 MG, IU) by Intrauterine  route.     No current facility-administered medications on file prior to visit.     The following portions of the patient's history were reviewed and updated as appropriate: allergies, current medications, past family history, past medical history, past social history, past surgical history and problem list.  ROS Otherwise as in subjective above  Objective: BP 110/80   Pulse 69   Wt 205 lb 3.2 oz (93.1 kg)   BMI 34.15 kg/m   General appearance: alert, no distress, well developed, well nourished Psych: pleasant , answers questions appropralrey    Assessment: Encounter Diagnoses  Name Primary?   Generalized anxiety disorder Yes   Moderate persistent extrinsic asthma without complication    Abnormal iron saturation    Impaired fasting blood sugar      Plan: Allergic asthma-she had some improvement with Breo but seems to need a little more potency.  Continue Zyrtec, changed to Symbicort preventative, changed to ProAir rescue since the generic was not working that well either.  We discussed the potential use of spacer.  We discussed the difference between preventative and rescue inhalers and proper use of medication.  Anxiety-advised counseling.  She should have counseling available through health at work.  Begin trial of Wellbutrin.  We discussed ways to deal with anxiety and stress.  Follow-up in 1 month.  On  recent labs she had abnormal iron saturation but normal iron level.  We will plan to check some additional labs including folate and B12 in a few months  Impaired glucose-she has made dietary changes and has lost some weight since last visit.  Plan to recheck hemoglobin A1c in about 3 months we will recheck the folate and B12.  I congratulated her on her weight loss and dietary changes.  [name] was seen today for follow-up on asthma.  Diagnoses and all orders for this visit:  Generalized anxiety disorder  Moderate persistent extrinsic asthma without  complication  Abnormal iron saturation  Impaired fasting blood sugar  Other orders -     Albuterol Sulfate (PROAIR RESPICLICK) 108 (90 Base) MCG/ACT AEPB; Inhale 2 puffs into the lungs every 6 (six) hours as needed. -     budesonide-formoterol (SYMBICORT) 160-4.5 MCG/ACT inhaler; Inhale 2 puffs into the lungs 2 (two) times daily. -     buPROPion (WELLBUTRIN XL) 150 MG 24 hr tablet; Take 1 tablet (150 mg total) by mouth daily.    Follow up: 78mo

## 2022-03-29 ENCOUNTER — Telehealth: Payer: Self-pay | Admitting: Medical

## 2022-03-29 MED ORDER — PROAIR DIGIHALER 108 (90 BASE) MCG/ACT IN AEPB: 2.0000 | INHALATION_SPRAY | Freq: Four times a day (QID) | RESPIRATORY_TRACT | 1 refills | Status: DC

## 2022-03-29 NOTE — Telephone Encounter (Signed)
Recv'd fax Proair respiclick not covered but Digihaler is, changed medication

## 2022-04-16 ENCOUNTER — Encounter: Payer: Self-pay | Admitting: Internal Medicine

## 2022-04-25 ENCOUNTER — Ambulatory Visit: Payer: BLUE CROSS/BLUE SHIELD | Admitting: Family Medicine

## 2022-05-08 ENCOUNTER — Encounter: Payer: Self-pay | Admitting: Internal Medicine

## 2022-05-08 ENCOUNTER — Ambulatory Visit: Payer: BLUE CROSS/BLUE SHIELD | Admitting: Medical

## 2022-05-14 ENCOUNTER — Ambulatory Visit (INDEPENDENT_AMBULATORY_CARE_PROVIDER_SITE_OTHER): Payer: BLUE CROSS/BLUE SHIELD | Admitting: Medical

## 2022-05-14 VITALS — BP 110/64 | HR 79 | Wt 200.8 lb

## 2022-05-14 DIAGNOSIS — E669 Obesity, unspecified: Secondary | ICD-10-CM | POA: Diagnosis not present

## 2022-05-14 DIAGNOSIS — F4322 Adjustment disorder with anxiety: Secondary | ICD-10-CM | POA: Diagnosis not present

## 2022-05-14 DIAGNOSIS — J4540 Moderate persistent asthma, uncomplicated: Secondary | ICD-10-CM

## 2022-05-14 MED ORDER — BUPROPION HCL ER (XL) 300 MG PO TB24: 300.0000 mg | ORAL_TABLET | Freq: Every day | ORAL | 1 refills | Status: DC

## 2022-05-14 NOTE — Progress Notes (Signed)
Subjective:  Emily Mason is a 36 y.o. female who presents for Chief Complaint  Patient presents with   6 week follow-up    6 week follow-up. On wellbutrin. Doing well on wellbutrin      Here for follow-up from recent visit.  Anxiety, work stress, other stressors- Feels improvements since last visit, less stressed at work.  Started Wellbutrin last visit and so far doing ok on this.  Hasn't reached out to counseling though.  Overall feels like she is doing better with managing stress.  She also cut back on work days.  She was working 5 to 6 days/week and she is trying a shorter workweek  Obesity-she is continuing her efforts to lose weight through diet and exercise  Asthma-so far currently doing okay.  Using albuterol as needed/appropriate.  She was on Breo which worked okay but right now not having to use any preventative.  Symbicort was called out last visit but that was too expensive.  No other aggravating or relieving factors.    No other c/o.  Past Medical History:  Diagnosis Date   History of COVID-19    2021 and 2022   UTI (lower urinary tract infection)    Current Outpatient Medications on File Prior to Visit  Medication Sig Dispense Refill   cetirizine (ZYRTEC) 10 MG tablet Take 10 mg by mouth daily.     PROAIR DIGIHALER 108 (90 Base) MCG/ACT AEPB Inhale 2 puffs into the lungs every 6 (six) hours. 1 each 1   Levonorgestrel (MIRENA, 52 MG, IU) by Intrauterine route.     No current facility-administered medications on file prior to visit.   The following portions of the patient's history were reviewed and updated as appropriate: allergies, current medications, past family history, past medical history, past social history, past surgical history and problem list.  ROS Otherwise as in subjective above    Objective: BP 110/64   Pulse 79   Wt 200 lb 12.8 oz (91.1 kg)   BMI 33.41 kg/m   Wt Readings from Last 3 Encounters:  05/14/22 200 lb 12.8 oz (91.1 kg)   03/27/22 205 lb 3.2 oz (93.1 kg)  02/20/22 211 lb 12.8 oz (96.1 kg)    General appearance: alert, no distress, well developed, well nourished Psych: pleasant , answers questions appropriately    Assessment: Encounter Diagnoses  Name Primary?   Adjustment disorder with anxiety Yes   Moderate persistent extrinsic asthma without complication    Obesity (BMI 30-39.9)       Plan: Allergic asthma-doing fine with prior only right now.  Symbicort was too expensive.  She lives with [facility] but right now her asthma is calm down.  She also feels like her asthma is improved with weight loss.  Continue efforts to lose weight through healthy diet and exercise  Anxiety, adjustment disorder, work stress-again reiterated the need to seek counseling.  We will increase Wellbutrin to 300 mg XL daily.  Overall improved.  Discussed other ways to deal with stress and anxiety   Maebell was seen today for 6 week follow-up.  Diagnoses and all orders for this visit:  Adjustment disorder with anxiety  Moderate persistent extrinsic asthma without complication  Obesity (BMI 30-39.9)  Other orders -     buPROPion (WELLBUTRIN XL) 300 MG 24 hr tablet; Take 1 tablet (300 mg total) by mouth daily.    Follow up: 64mo

## 2022-05-20 ENCOUNTER — Encounter: Payer: Self-pay | Admitting: Internal Medicine

## 2022-07-15 IMAGING — CT CT ANGIO CHEST
2 of 7 series · 13 of 36 positions shown · IV contrast (agent unspecified)
Comparison: None Available.

CLINICAL DATA: Shortness of breath and chest pressure.

EXAM:
CT ANGIOGRAPHY CHEST WITH CONTRAST
TECHNIQUE: Multidetector CT imaging of the chest was performed using the
standard protocol during bolus administration of intravenous
contrast. Multiplanar CT image reconstructions and MIPs were
obtained to evaluate the vascular anatomy.

[Series 5: pe axial thins · axial · 0.63mm/px · z∈[-466,-188]mm · 12 of 330 slices shown]
[im 26/330  lung]
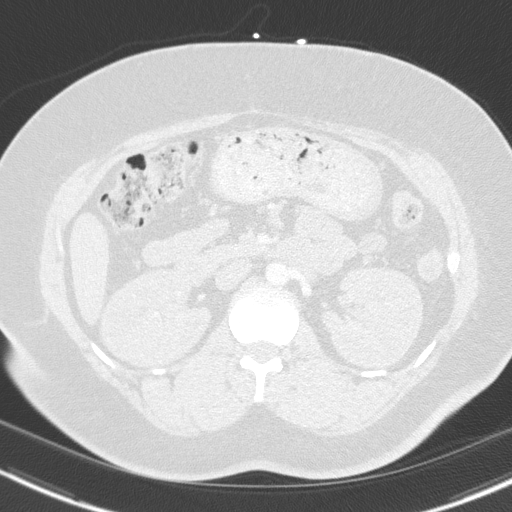
[im 51/330  mediastinal]
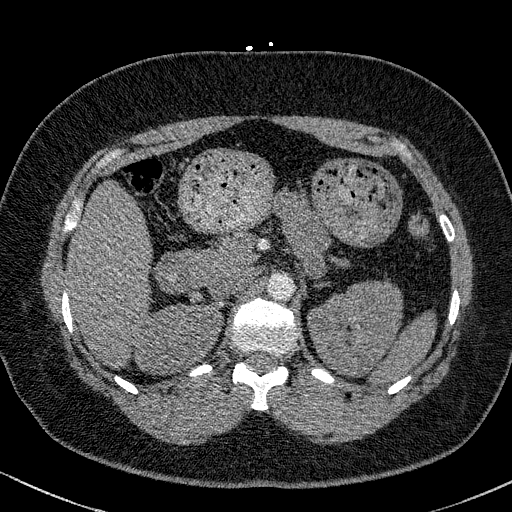
[im 76/330  lung]
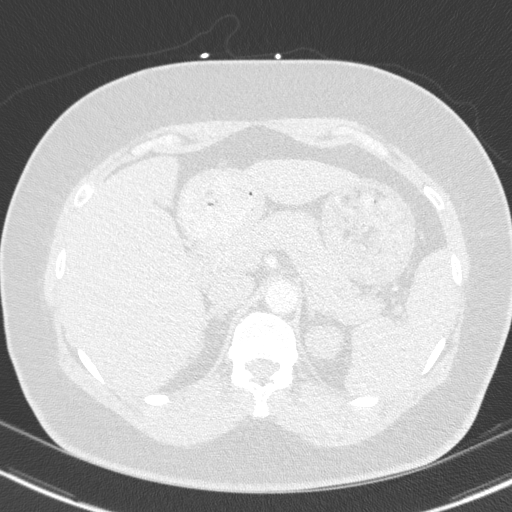
[im 102/330  mediastinal]
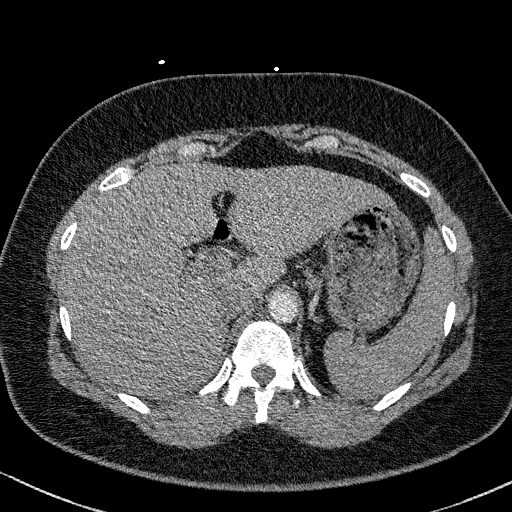
[im 127/330  lung]
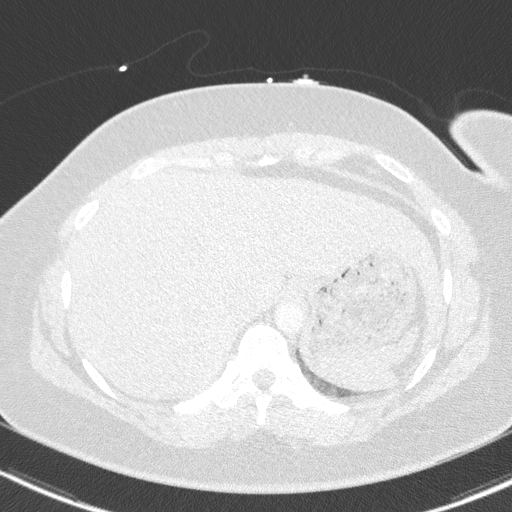
[im 152/330  mediastinal]
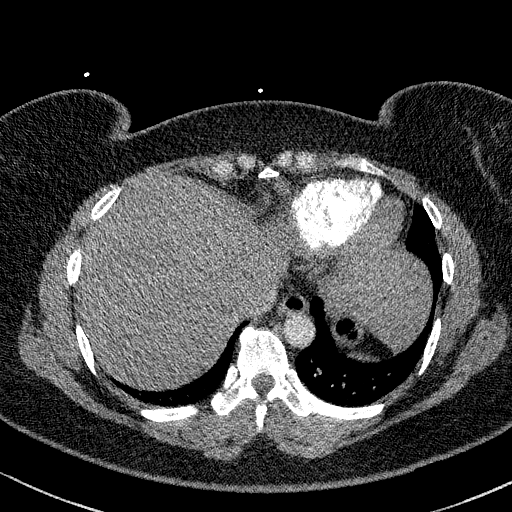
[im 178/330  lung]
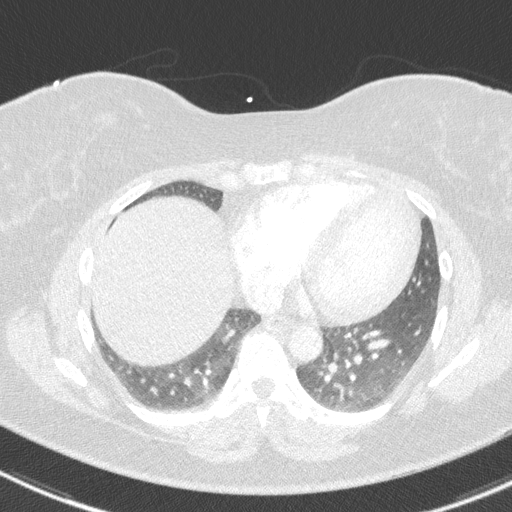
[im 203/330  mediastinal]
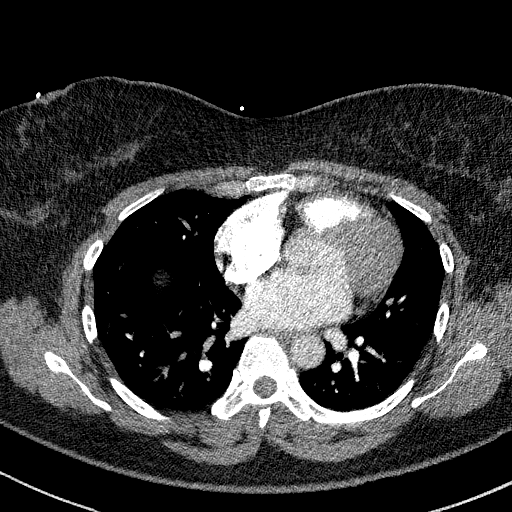
[im 228/330  lung]
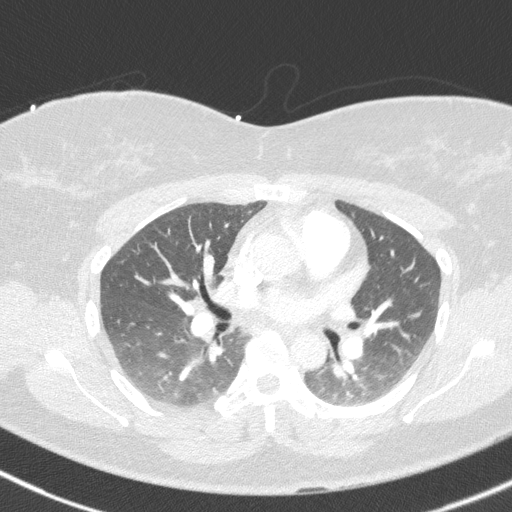
[im 254/330  mediastinal]
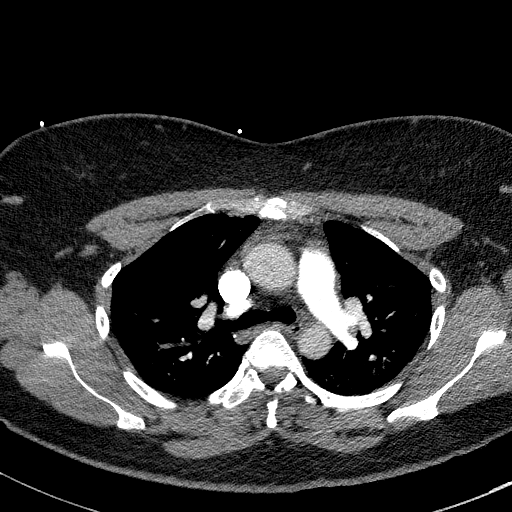
[im 279/330  lung]
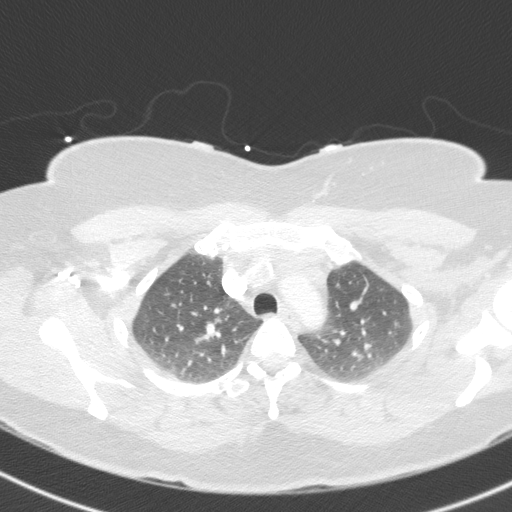
[im 304/330  mediastinal]
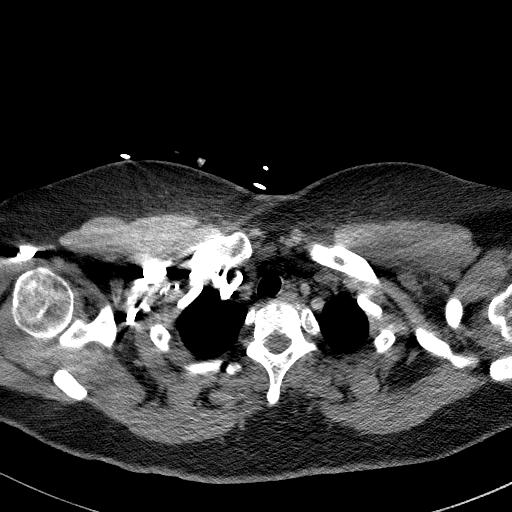

[Series 7: cor soft · coronal · 0.67mm/px · 1 of 111 slices shown]
[im 56/111  mediastinal]
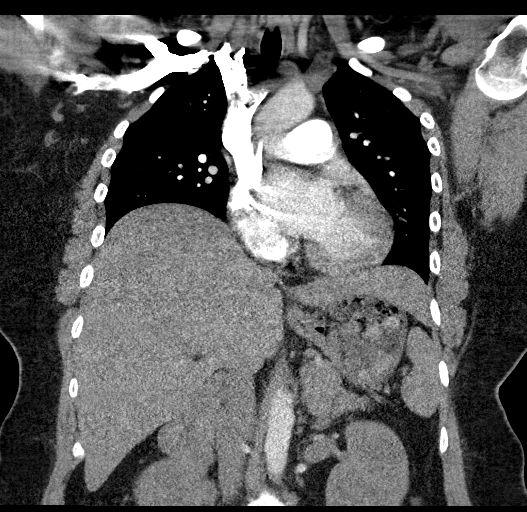

[13 of 36 positions shown; findings below may reference images not displayed]

RADIATION DOSE REDUCTION: This exam was performed according to the
departmental dose-optimization program which includes automated
exposure control, adjustment of the mA and/or kV according to
patient size and/or use of iterative reconstruction technique.

CONTRAST:  61mL OMNIPAQUE IOHEXOL 350 MG/ML SOLN
FINDINGS: Cardiovascular: Satisfactory opacification of the pulmonary arteries
to the segmental level. No evidence of pulmonary embolism. Normal
heart size. No pericardial effusion.

Mediastinum/Nodes: No enlarged mediastinal, hilar, or axillary lymph
nodes. Thyroid gland, trachea, and esophagus demonstrate no
significant findings.

Lungs/Pleura: Lungs are clear. No pleural effusion or pneumothorax.

Upper Abdomen: Cholecystectomy clips are present.

Musculoskeletal: No chest wall abnormality. No acute or significant
osseous findings.

Review of the MIP images confirms the above findings.
IMPRESSION: 1. No evidence for pulmonary embolism. No acute cardiopulmonary
process.

## 2022-07-30 ENCOUNTER — Encounter: Payer: Self-pay | Admitting: Medical

## 2022-07-30 ENCOUNTER — Encounter: Payer: Self-pay | Admitting: Internal Medicine

## 2022-07-30 ENCOUNTER — Telehealth: Payer: BLUE CROSS/BLUE SHIELD | Admitting: Medical

## 2022-07-30 VITALS — Temp 99.0°F | Ht 64.0 in | Wt 194.0 lb

## 2022-07-30 DIAGNOSIS — R6889 Other general symptoms and signs: Secondary | ICD-10-CM

## 2022-07-30 DIAGNOSIS — J111 Influenza due to unidentified influenza virus with other respiratory manifestations: Secondary | ICD-10-CM | POA: Diagnosis not present

## 2022-07-30 DIAGNOSIS — Z20828 Contact with and (suspected) exposure to other viral communicable diseases: Secondary | ICD-10-CM

## 2022-07-30 MED ORDER — OSELTAMIVIR PHOSPHATE 75 MG PO CAPS: 75.0000 mg | ORAL_CAPSULE | Freq: Two times a day (BID) | ORAL | 0 refills | Status: DC

## 2022-07-30 NOTE — Progress Notes (Signed)
Sent letter through my chart

## 2022-07-30 NOTE — Progress Notes (Signed)
Subjective:     Patient ID: Emily Mason, female   DOB: Oct 21, 1985, 36 y.o.   MRN: [id number]  This visit type was conducted due to national recommendations for restrictions regarding the COVID-19 Pandemic (e.g. social distancing) in an effort to limit this patient's exposure and mitigate transmission in our community.  Due to their co-morbid illnesses, this patient is at least at moderate risk for complications without adequate follow up.  This format is felt to be most appropriate for this patient at this time.    Documentation for virtual audio and video telecommunications through [place] encounter:  The patient was located at home. The provider was located in the office. The patient did consent to this visit and is aware of possible charges through their insurance for this visit.  The other persons participating in this telemedicine service were none. Time spent on call was 20 minutes and in review of previous records 20 minutes total.  This virtual service is not related to other E/M service within previous 7 days.   HPI Chief Complaint  Patient presents with   other    All three of kids have the flu A, ST, cough, congestion, no fever, body aches,    Virtual consult for illness.   She notes exposure to flu. All of her children have the flu.  She notes her symptoms began yesterday.  She notes congestion, runny nose, headaches, body aches, cough.  No fever.  No NVD.  No SOB.  Using tylenol cold and flu.   No hx/o asthma or lung disease.  No other aggravating or relieving factors. No other complaint.   Past Medical History:  Diagnosis Date   History of COVID-19    2021 and 2022   UTI (lower urinary tract infection)    Current Outpatient Medications on File Prior to Visit  Medication Sig Dispense Refill   buPROPion (WELLBUTRIN XL) 300 MG 24 hr tablet Take 1 tablet (300 mg total) by mouth daily. 90 tablet 1   cetirizine (ZYRTEC) 10 MG tablet Take 10 mg by mouth daily.      Levonorgestrel (MIRENA, 52 MG, IU) by Intrauterine route.     PROAIR DIGIHALER 108 (90 Base) MCG/ACT AEPB Inhale 2 puffs into the lungs every 6 (six) hours. 1 each 1   No current facility-administered medications on file prior to visit.   Review of Systems As in subjective    Objective:   Physical Exam Due to coronavirus pandemic stay at home measures, patient visit was virtual and they were not examined in person.   Temp 99 F (37.2 C)   Ht 5\' 4"  (1.626 m)   Wt 194 lb (88 kg)   BMI 33.30 kg/m   Gen: wd, wn, nad Somewhat ill appearing but no labored breathing In good spirits      Assessment:     Encounter Diagnoses  Name Primary?   Influenza Yes   Flu-like symptoms    Exposure to influenza        Plan:     Discussed symptoms, exposure in household.   Discussed recommendations below  Prescription given for Tamiflu, discussed risks/benefits of medication.    Discussed diagnosis of influenza. Discussed supportive care including rest, hydration, OTC Tylenol or NSAID for fever, aches, and malaise.  Discussed period of contagion, self quarantine at home away from others to avoid spread of disease, discussed means of transmission, and possible complications including pneumonia.  If worse or not improving within the next 4-5 days, then  call or return.  Patient voiced understanding of diagnosis, recommendations, and treatment plan.  Gave note for work.  Emily Mason was seen today for other.  Diagnoses and all orders for this visit:  Influenza  Flu-like symptoms  Exposure to influenza  Other orders -     oseltamivir (TAMIFLU) 75 MG capsule; Take 1 capsule (75 mg total) by mouth 2 (two) times daily.    F/u prn

## 2022-07-31 ENCOUNTER — Ambulatory Visit: Payer: BLUE CROSS/BLUE SHIELD | Admitting: Medical

## 2022-09-19 ENCOUNTER — Telehealth: Payer: BLUE CROSS/BLUE SHIELD | Admitting: Medical

## 2022-09-19 DIAGNOSIS — J029 Acute pharyngitis, unspecified: Secondary | ICD-10-CM | POA: Diagnosis not present

## 2022-09-19 DIAGNOSIS — Z20818 Contact with and (suspected) exposure to other bacterial communicable diseases: Secondary | ICD-10-CM | POA: Diagnosis not present

## 2022-09-19 MED ORDER — AMOXICILLIN 500 MG PO CAPS: 500.0000 mg | ORAL_CAPSULE | Freq: Three times a day (TID) | ORAL | 0 refills | Status: AC

## 2022-09-19 NOTE — Progress Notes (Signed)
Subjective:     Patient ID: Emily Mason, female   DOB: 04/17/1986, 37 y.o.   MRN: [id number]  This visit type was conducted due to national recommendations for restrictions regarding the COVID-19 Pandemic (e.g. social distancing) in an effort to limit this patient's exposure and mitigate transmission in our community.  Due to their co-morbid illnesses, this patient is at least at moderate risk for complications without adequate follow up.  This format is felt to be most appropriate for this patient at this time.    Documentation for virtual audio and video telecommunications through [facility] encounter:  The patient was located at home. The provider was located in the office. The patient did consent to this visit and is aware of possible charges through their insurance for this visit.  The other persons participating in this telemedicine service were none. Time spent on call was 20 minutes and in review of previous records 20 minutes total.  This virtual service is not related to other E/M service within previous 7 days.   HPI Chief Complaint  Patient presents with   Sore Throat    Sore throat, Tuesday symptoms started sore throat, scratchy, both kids have tested positive this week for strep.    Virtual consult for illness.  She notes 3-day history of sore throat, headache, some sinus pressure, some cough.  No body aches or chills.  She has had fever subjective.  No nausea vomiting or diarrhea.  No shortness of breath or wheezing.  All 3 of her children are sick and 2 of them tested positive for strep this week.  She and her kids have tested negative for COVID.  Her youngest child tested negative initially for strep but they are waiting confirmation culture.No other aggravating or relieving factors. No other complaint.  Past Medical History:  Diagnosis Date   History of COVID-19    2021 and 2022   UTI (lower urinary tract infection)    Current Outpatient Medications on File Prior  to Visit  Medication Sig Dispense Refill   buPROPion (WELLBUTRIN XL) 300 MG 24 hr tablet Take 1 tablet (300 mg total) by mouth daily. 90 tablet 1   cetirizine (ZYRTEC) 10 MG tablet Take 10 mg by mouth daily.     Levonorgestrel (MIRENA, 52 MG, IU) by Intrauterine route.     PROAIR DIGIHALER 108 (90 Base) MCG/ACT AEPB Inhale 2 puffs into the lungs every 6 (six) hours. 1 each 1   No current facility-administered medications on file prior to visit.    Review of Systems As in subjective    Objective:   Physical Exam Due to coronavirus pandemic stay at home measures, patient visit was virtual and they were not examined in person.    Gen: wd, wn, nad No labored breathing or wheezing Not particularly ill appearing     Assessment:     Encounter Diagnoses  Name Primary?   Sore throat Yes   Streptococcus exposure        Plan:     We discussed symptoms and concerns.  Discussed supportive care, salt water gargles, warm fluids, ibuprofen for fever or pain.  Given the exposure and given the symptoms we will go ahead and put her on a round of amoxicillin for strep exposure.  Can use over-the-counter Mucinex DM if needed for congestion or cough.  Discussed that although cough was not typical for strep she has multiple exposures in the household and worried about her exposure.  If not much improved within  the next 72 hours then call or recheck.  Emily Mason was seen today for sore throat.  Diagnoses and all orders for this visit:  Sore throat  Streptococcus exposure  Other orders -     amoxicillin (AMOXIL) 500 MG capsule; Take 1 capsule (500 mg total) by mouth 3 (three) times daily for 10 days.    F/u prn

## 2023-01-08 ENCOUNTER — Encounter: Payer: Self-pay | Admitting: Physician Assistant

## 2023-01-08 ENCOUNTER — Other Ambulatory Visit (INDEPENDENT_AMBULATORY_CARE_PROVIDER_SITE_OTHER): Payer: BLUE CROSS/BLUE SHIELD

## 2023-01-08 ENCOUNTER — Ambulatory Visit: Payer: BLUE CROSS/BLUE SHIELD | Admitting: Physician Assistant

## 2023-01-08 DIAGNOSIS — M778 Other enthesopathies, not elsewhere classified: Secondary | ICD-10-CM | POA: Diagnosis not present

## 2023-01-08 DIAGNOSIS — M25532 Pain in left wrist: Secondary | ICD-10-CM

## 2023-01-08 NOTE — Progress Notes (Signed)
Office Visit Note   Patient: Emily Mason           Date of Birth: Aug 23, 1985           MRN: [id number] Visit Date: 01/08/2023              Requested by: Jac Canavan, PA-C [address] [place],  [place] [address] PCP: Jac Canavan, PA-C  Chief Complaint  Patient presents with   Left Wrist - Pain      HPI: Patient is a pleasant 37 year old woman with a chief complaint of left wrist pain.  She denies any previous history but does say she fell onto her wrist several days ago but did not think much of it.  She points to pain over the ulnar side of her wrist.  No pain radially.  Denies any paresthesias.  She is right-hand dominant Assessment & Plan: Visit Diagnoses:  1. Pain in left wrist     Plan: Her exam is consistent with some tendinitis possibly ECU tendinitis she does have some clicking but has good strength.  Since she only did this a few days ago I will try immobilization and anti-inflammatories.  Will reevaluate her in a few weeks.  Certainly I discussed with her that hopefully this is just a strain cannot rule out the possibility of a tear.  If she does not improve would order an MRI.  Follow-Up Instructions: 3 weeks  Ortho Exam  Patient is alert, oriented, no adenopathy, well-dressed, normal affect, normal respiratory effort. Examination of her left wrist she is neurovascular intact no swelling no erythema she has good flexion and extension though has some pain reproduced with resisted extension over the ulnar side of her wrist.  Does have some tenderness over the ECU.  No tenderness over the radial side of her wrist no tenderness over the hand good grip strength does have some pain with ulnar deviation  Imaging: XR Wrist Complete Left  Result Date: 01/08/2023 Radiographs of the left wrist were obtained today well-maintained alignment no evidence of any significant degenerative changes or fractures.  No images are attached to the encounter.  Labs: Lab  Results  Component Value Date   HGBA1C 6.3 (H) 02/20/2022     Lab Results  Component Value Date   ALBUMIN 4.2 01/05/2022   ALBUMIN 4.3 07/10/2020   ALBUMIN 2.9 (L) 11/02/2017    No results found for: "MG" No results found for: "VD25OH"  No results found for: "PREALBUMIN"    Latest Ref Rng & Units 01/05/2022    9:47 PM 07/10/2020    2:27 PM 11/03/2017    5:31 AM  CBC EXTENDED  WBC 4.0 - 10.5 K/uL 7.2  7.4  24.1   RBC 3.87 - 5.11 MIL/uL 5.13  5.22  3.97   Hemoglobin 12.0 - 15.0 g/dL 19.1  47.8  9.7   HCT 29.5 - 46.0 % 38.8  40.4  28.9   Platelets 150 - 400 K/uL 245  247  169   NEUT# 1.7 - 7.7 K/uL 4.3  4.8    Lymph# 0.7 - 4.0 K/uL 2.3  1.9       There is no height or weight on file to calculate BMI.  Orders:  Orders Placed This Encounter  Procedures   XR Wrist Complete Left   No orders of the defined types were placed in this encounter.    Procedures: No procedures performed  Clinical Data: No additional findings.  ROS:  All other systems  negative, except as noted in the HPI. Review of Systems  Objective: Vital Signs: There were no vitals taken for this visit.  Specialty Comments:  No specialty comments available.  PMFS History: Patient Active Problem List   Diagnosis Date Noted   Generalized anxiety disorder 03/27/2022   Abnormal iron saturation 03/27/2022   Extrinsic asthma 03/27/2022   Impaired fasting blood sugar 03/27/2022   Encounter for health maintenance examination in adult 02/20/2022   Need for pneumococcal vaccination 02/20/2022   Vaccine counseling 02/20/2022   Screening for diabetes mellitus 02/20/2022   History of anemia 02/20/2022   Plantar fasciitis 02/20/2022   Foot pain, left 02/20/2022   Upper back pain 02/20/2022   Muscle spasm 02/20/2022   Precordial chest pain 01/08/2022   Obesity (BMI 30-39.9) 07/10/2020   Adjustment disorder with anxiety 07/10/2020   Vaginal delivery 11/03/2017   Postpartum hemorrhage 11/03/2017    S/P cholecystectomy 07/22/2016   Past Medical History:  Diagnosis Date   History of COVID-19    2021 and 2022   UTI (lower urinary tract infection)     Family History  Problem Relation Age of Onset   Gout Brother    Asthma Neg Hx    Cancer Neg Hx    Diabetes Neg Hx    Heart disease Neg Hx    Hyperlipidemia Neg Hx    Hypertension Neg Hx    Stroke Neg Hx     Past Surgical History:  Procedure Laterality Date   CHOLECYSTECTOMY N/A 02/21/2016   Procedure: LAPAROSCOPIC CHOLECYSTECTOMY WITH INTRAOPERATIVE CHOLANGIOGRAM;  Surgeon: Claud Kelp, MD;  Location: [facility];  Service: General;  Laterality: N/A;   DILATION AND CURETTAGE OF UTERUS N/A 11/02/2017   Procedure: DILATATION AND CURETTAGE;  Surgeon: Essie Hart, MD;  Location: [facility];  Service: Gynecology;  Laterality: N/A;   WISDOM TOOTH EXTRACTION     Social History   Occupational History   Not on file  Tobacco Use   Smoking status: Never   Smokeless tobacco: Never  Substance and Sexual Activity   Alcohol use: Not Currently    Comment: social    Drug use: No   Sexual activity: Not Currently

## 2023-01-12 ENCOUNTER — Ambulatory Visit: Payer: BLUE CROSS/BLUE SHIELD | Admitting: Physician Assistant

## 2023-01-29 ENCOUNTER — Ambulatory Visit: Payer: BLUE CROSS/BLUE SHIELD | Admitting: Physician Assistant

## 2023-02-17 ENCOUNTER — Encounter: Payer: Self-pay | Admitting: Medical

## 2023-02-17 ENCOUNTER — Ambulatory Visit: Payer: BLUE CROSS/BLUE SHIELD | Admitting: Medical

## 2023-02-17 VITALS — BP 136/80 | HR 86 | Temp 97.9°F | Ht 64.0 in | Wt 206.8 lb

## 2023-02-17 DIAGNOSIS — W57XXXA Bitten or stung by nonvenomous insect and other nonvenomous arthropods, initial encounter: Secondary | ICD-10-CM

## 2023-02-17 DIAGNOSIS — S80861A Insect bite (nonvenomous), right lower leg, initial encounter: Secondary | ICD-10-CM | POA: Diagnosis not present

## 2023-02-17 DIAGNOSIS — R519 Headache, unspecified: Secondary | ICD-10-CM

## 2023-02-17 MED ORDER — HYDROCODONE-ACETAMINOPHEN 5-325 MG PO TABS
1.0000 | ORAL_TABLET | Freq: Four times a day (QID) | ORAL | 0 refills | Status: DC | PRN
Start: 2023-02-17 — End: 2023-02-26

## 2023-02-17 MED ORDER — PREDNISONE 20 MG PO TABS: ORAL_TABLET | ORAL | 0 refills | Status: DC

## 2023-02-17 NOTE — Progress Notes (Signed)
Subjective:  Emily Mason is a 37 y.o. female who presents for Chief Complaint  Patient presents with   Headache    Has had HA x 2 weeks, noticed a bite on her lower right leg on 02/08/23. Bite was itching and had clear liquid that popped and grew back again. When it drained it was a white milky color. Has had a HA since the 30th. Nothing helps. Has done ice pack, ibuprofen, tylenol, excedrin migraine and massage.      Here for c/o headaches, frontal and top of head, last all day, dull.  At times throbbing.   Sometimes gets dizziness with the headache.  Sometimes photophobia. No nausea.  Some tingling of right hand at times.  Doesn't normally get headaches.  Current headache about 7-8/10 at times.    Drinks good amount of water throughout the day.  Sleeps ok mostly.  Doesn't drink a lot of caffeine  Has tried Excedrin, ibuprofen, tylenol.  Has used cool therapy , massage therapy.     Thinks she was bitten by a bug on right calf on 02/06/22, not sure what bit her.  Initially had itching, clear liquid drainage, some redness and swollen.  At one point had red round warm area at bite.   Had formed and bursted 2 different times.  Initially thought it could have been mosquito.  Never saw a tick.  No other aggravating or relieving factors.    No other c/o.  Past Medical History:  Diagnosis Date   History of COVID-19    2021 and 2022   UTI (lower urinary tract infection)    Current Outpatient Medications on File Prior to Visit  Medication Sig Dispense Refill   cetirizine (ZYRTEC) 10 MG tablet Take 10 mg by mouth daily.     ibuprofen (ADVIL) 200 MG tablet Take 800 mg by mouth every 6 (six) hours as needed.     Levonorgestrel (MIRENA, 52 MG, IU) by Intrauterine route.     buPROPion (WELLBUTRIN XL) 300 MG 24 hr tablet Take 1 tablet (300 mg total) by mouth daily. (Patient not taking: Reported on 02/17/2023) 90 tablet 1   PROAIR DIGIHALER 108 (90 Base) MCG/ACT AEPB Inhale 2 puffs into the lungs every  6 (six) hours. (Patient not taking: Reported on 02/17/2023) 1 each 1   No current facility-administered medications on file prior to visit.     The following portions of the patient's history were reviewed and updated as appropriate: allergies, current medications, past family history, past medical history, past social history, past surgical history and problem list.   Review of Systems  Constitutional:  Negative for chills, fever, malaise/fatigue and weight loss.  HENT:  Negative for congestion, ear pain, hearing loss, sore throat and tinnitus.   Eyes:  Negative for blurred vision, pain and redness.  Respiratory:  Negative for cough, hemoptysis and shortness of breath.   Cardiovascular:  Negative for chest pain, palpitations, orthopnea, claudication and leg swelling.  Gastrointestinal:  Negative for abdominal pain, blood in stool, constipation, diarrhea, nausea and vomiting.  Genitourinary:  Negative for dysuria, flank pain, frequency, hematuria and urgency.  Musculoskeletal:  Negative for falls, joint pain and myalgias.  Skin:  Negative for itching and rash.  Neurological:  Positive for dizziness, tingling and headaches. Negative for speech change and weakness.  Endo/Heme/Allergies:  Negative for polydipsia. Does not bruise/bleed easily.  Psychiatric/Behavioral:  Negative for depression and memory loss. The patient is not nervous/anxious and does not have insomnia.  Objective: BP 136/80   Pulse 86   Temp 97.9 F (36.6 C) (Tympanic)   Ht 5\' 4"  (1.626 m)   Wt 206 lb 12.8 oz (93.8 kg)   LMP 02/17/2023   SpO2 98%   BMI 35.50 kg/m   Wt Readings from Last 3 Encounters:  02/17/23 206 lb 12.8 oz (93.8 kg)  07/30/22 194 lb (88 kg)  05/14/22 200 lb 12.8 oz (91.1 kg)   BP Readings from Last 3 Encounters:  02/17/23 136/80  05/14/22 110/64  03/27/22 110/80    General appearance: alert, no distress, well developed, well nourished HEENT: normocephalic, sclerae anicteric,  conjunctiva pink and moist, TMs pearly, nares patent, no discharge or erythema, pharynx normal Oral cavity: MMM, no lesions Neck: supple, no lymphadenopathy, no thyromegaly, no masses Heart: RRR, normal S1, S2, no murmurs Lungs: CTA bilaterally, no wheezes, rhonchi, or rales Pulses: 2+ radial pulses, 2+ pedal pulses, normal cap refill Ext: no edema Neuro: CN II to XII intact, nonfocal exam   Assessment: Encounter Diagnoses  Name Primary?   Intractable headache, unspecified chronicity pattern, unspecified headache type Yes   Insect bite of right lower leg, initial encounter      Plan: We discussed symptoms and concerns.  Headache of unclear trigger.  She does not have a history of chronic headaches or primary headaches.  She did have a recent insect bite that I do not think that is necessarily related to her headache.  We discussed the possibility of tick bite and Lyme disease she has been with her symptoms of Lyme disease of the rash.  No rash at all today.  There is just a small scab area where she had a recent insect bite.  We discussed other possible causes of headache including sleep apnea, tumor, aneurysm, stress or other.  She is going to keep a headache diary.  She is going to monitor blood pressures a few times per week and bring numbers in at her upcoming physical.  She is scheduled to have a well visit soon.  In the short-term she can use the medications below including prednisone for the next 3 days and short-term as needed use of hydrocodone.  Discussed risk and benefits and proper use of medication.  She can also use Excedrin over-the-counter as a backup if not improving in the next 2 days.  If no improvement at all within the next 3 days call back.  Taniya was seen today for headache.  Diagnoses and all orders for this visit:  Intractable headache, unspecified chronicity pattern, unspecified headache type  Insect bite of right lower leg, initial encounter  Other  orders -     predniSONE (DELTASONE) 20 MG tablet; 3 tablets today, 2 tablets tomorrow, 1 tablet the third day -     HYDROcodone-acetaminophen (NORCO) 5-325 MG tablet; Take 1 tablet by mouth every 6 (six) hours as needed.    Follow up: soon for well visit

## 2023-02-26 ENCOUNTER — Ambulatory Visit: Payer: BLUE CROSS/BLUE SHIELD | Admitting: Medical

## 2023-02-26 ENCOUNTER — Encounter: Payer: Self-pay | Admitting: Internal Medicine

## 2023-02-26 ENCOUNTER — Telehealth: Payer: Self-pay | Admitting: Medical

## 2023-02-26 ENCOUNTER — Encounter: Payer: Self-pay | Admitting: Medical

## 2023-02-26 VITALS — BP 112/80 | HR 81 | Ht 65.0 in | Wt 204.8 lb

## 2023-02-26 DIAGNOSIS — R7301 Impaired fasting glucose: Secondary | ICD-10-CM

## 2023-02-26 DIAGNOSIS — Z0000 Encounter for general adult medical examination without abnormal findings: Secondary | ICD-10-CM | POA: Diagnosis not present

## 2023-02-26 DIAGNOSIS — F411 Generalized anxiety disorder: Secondary | ICD-10-CM

## 2023-02-26 DIAGNOSIS — J4540 Moderate persistent asthma, uncomplicated: Secondary | ICD-10-CM

## 2023-02-26 DIAGNOSIS — E669 Obesity, unspecified: Secondary | ICD-10-CM

## 2023-02-26 DIAGNOSIS — Z862 Personal history of diseases of the blood and blood-forming organs and certain disorders involving the immune mechanism: Secondary | ICD-10-CM

## 2023-02-26 DIAGNOSIS — Z7185 Encounter for immunization safety counseling: Secondary | ICD-10-CM

## 2023-02-26 DIAGNOSIS — Z13220 Encounter for screening for lipoid disorders: Secondary | ICD-10-CM | POA: Insufficient documentation

## 2023-02-26 DIAGNOSIS — Z131 Encounter for screening for diabetes mellitus: Secondary | ICD-10-CM | POA: Diagnosis not present

## 2023-02-26 MED ORDER — OMEPRAZOLE 40 MG PO CPDR: 40.0000 mg | DELAYED_RELEASE_CAPSULE | Freq: Every day | ORAL | 1 refills | Status: DC

## 2023-02-26 MED ORDER — PROAIR DIGIHALER 108 (90 BASE) MCG/ACT IN AEPB: 2.0000 | INHALATION_SPRAY | Freq: Four times a day (QID) | RESPIRATORY_TRACT | 1 refills | Status: DC

## 2023-02-26 NOTE — Telephone Encounter (Signed)
[facility] called Insurance will not cover  Pro air Digihaler Can we change to just Proair not the digi  he feels ins will cover that way

## 2023-02-26 NOTE — Progress Notes (Signed)
Sent letter to receieve results for pap

## 2023-02-26 NOTE — Progress Notes (Signed)
Subjective:   HPI  Emily Mason is a 37 y.o. female who presents for Chief Complaint  Patient presents with   Annual Exam    Fasting cpe, would like to see an GI due to gastric issues- bloated, sometimes feeling pressure in head    Patient Care Team: Juniel Groene, Cleda Mccreedy as PCP - General (Family Medicine) Sees dentist Sees eye doctor Dr. Rollene Rotunda, cardiology [facility]   Concerns: When eating, including small portion, feels full to the point of dyspnea.  Feels as though good sometimes hangs up in chest area.   Last year had similar symptoms, saw cardiology, but now thinks its related to GERD.  Has tried some tums, prilosec. No vomiting with eating.  Feels pressure in head.   Feels congested in nose, but doesn't feel sick.  Sometimes happens all day but most of the time in the evenings.  No current issues with plantar fascitis, has gotten a lot better.  She has history of iron deficiency anemia.  No current concerns.  Has had iron infusions in the past.  Gynecological history: 5 pregnancies, 3 live births, 2 miscarriages.   Periods are light with Mirena.    Has Mirena since 2019, 5 year Mirena.  Reviewed their medical, surgical, family, social, medication, and allergy history and updated chart as appropriate.  Past Medical History:  Diagnosis Date   Asthma    GERD (gastroesophageal reflux disease)    History of COVID-19    2021 and 2022   Iron deficiency anemia    UTI (lower urinary tract infection)     Family History  Problem Relation Age of Onset   Gout Brother    Asthma Neg Hx    Cancer Neg Hx    Diabetes Neg Hx    Heart disease Neg Hx    Hyperlipidemia Neg Hx    Hypertension Neg Hx    Stroke Neg Hx      Current Outpatient Medications:    cetirizine (ZYRTEC) 10 MG tablet, Take 10 mg by mouth daily., Disp: , Rfl:    Levonorgestrel (MIRENA, 52 MG, IU), by Intrauterine route., Disp: , Rfl:    omeprazole (PRILOSEC) 40 MG capsule, Take  1 capsule (40 mg total) by mouth daily., Disp: 30 capsule, Rfl: 1   PROAIR DIGIHALER 108 (90 Base) MCG/ACT AEPB, Inhale 2 puffs into the lungs every 6 (six) hours., Disp: 1 each, Rfl: 1  No Known Allergies   Review of Systems  Constitutional:  Negative for chills, fever, malaise/fatigue and weight loss.  HENT:  Negative for congestion, ear pain, hearing loss, sore throat and tinnitus.   Eyes:  Negative for blurred vision, pain and redness.  Respiratory:  Positive for shortness of breath. Negative for cough and hemoptysis.   Cardiovascular:  Negative for chest pain, palpitations, orthopnea, claudication and leg swelling.  Gastrointestinal:  Positive for abdominal pain. Negative for blood in stool, constipation, diarrhea, nausea and vomiting.  Genitourinary:  Negative for dysuria, flank pain, frequency, hematuria and urgency.  Musculoskeletal:  Negative for falls, joint pain and myalgias.  Skin:  Negative for itching and rash.  Neurological:  Negative for dizziness, tingling, speech change, weakness and headaches.  Endo/Heme/Allergies:  Negative for polydipsia. Does not bruise/bleed easily.  Psychiatric/Behavioral:  Negative for depression and memory loss. The patient is not nervous/anxious and does not have insomnia.         02/26/2023    8:22 AM 09/19/2022   10:34 AM 05/14/2022  9:14 AM 02/20/2022    3:36 PM 01/07/2022   11:15 AM  Depression screen PHQ 2/9  Decreased Interest 0 0 0 0 0  Down, Depressed, Hopeless 0 0 0 0 0  PHQ - 2 Score 0 0 0 0 0       Objective:  BP 112/80   Pulse 81   Ht 5\' 5"  (1.651 m)   Wt 204 lb 12.8 oz (92.9 kg)   LMP 02/17/2023   BMI 34.08 kg/m   Wt Readings from Last 3 Encounters:  02/26/23 204 lb 12.8 oz (92.9 kg)  02/17/23 206 lb 12.8 oz (93.8 kg)  07/30/22 194 lb (88 kg)   General appearance: alert, no distress, WD/WN, Asian female Skin: unremarkable, tattoo left medial forearm HEENT: normocephalic, conjunctiva/corneas normal, sclerae  anicteric, PERRLA, EOMi, nares patent, no discharge or erythema, pharynx normal Oral cavity: MMM, tongue normal, teeth normal Neck: supple, no lymphadenopathy, no thyromegaly, no masses, normal ROM, no bruits Chest: non tender, normal shape and expansion Heart: RRR, normal S1, S2, no murmurs Lungs: CTA bilaterally, no wheezes, rhonchi, or rales Abdomen: +bs, soft, non tender, non distended, no masses, no hepatomegaly, no splenomegaly, no bruits Back: non tender, normal ROM, no scoliosis Musculoskeletal: upper extremities non tender, no obvious deformity, normal ROM throughout, lower extremities non tender, no obvious deformity, normal ROM throughout Extremities: no edema, no cyanosis, no clubbing Pulses: 2+ symmetric, upper and lower extremities, normal cap refill Neurological: alert, oriented x 3, CN2-12 intact, strength normal upper extremities and lower extremities, sensation normal throughout, DTRs 2+ throughout, no cerebellar signs, gait normal Psychiatric: normal affect, behavior normal, pleasant  Breast/gyn/rectal - deferred to gynecology     Assessment and Plan :   Encounter Diagnoses  Name Primary?   Encounter for health maintenance examination in adult Yes   Impaired fasting blood sugar    Screening for diabetes mellitus    History of anemia    Generalized anxiety disorder    Moderate persistent extrinsic asthma without complication    Screening for lipid disorders    Vaccine counseling    Obesity, Class I, BMI 30.0-34.9 (see actual BMI)      This visit was a preventative care visit, also known as wellness visit or routine physical.   Topics typically include healthy lifestyle, diet, exercise, preventative care, vaccinations, sick and well care, proper use of emergency dept and after hours care, as well as other concerns.     Recommendations: Continue to return yearly for your annual wellness and preventative care visits.  This gives [place] a chance to discuss healthy  lifestyle, exercise, vaccinations, review your chart record, and perform screenings where appropriate.  I recommend you see your eye doctor yearly for routine vision care.  I recommend you see your dentist yearly for routine dental care including hygiene visits twice yearly.  See your gynecologist yearly for routine gynecological care.    Vaccination recommendations were reviewed Immunization History  Administered Date(s) Administered   Pneumococcal Polysaccharide-23 02/20/2022   Tdap 03/12/2015   Tetanus Immune Globulin 08/12/2017   Advised a yearly flu shot   Screening for cancer: Colon cancer screening: Age 55  Breast cancer screening: You should perform a self breast exam monthly.   We reviewed recommendations for regular mammograms and breast cancer screening.  Cervical cancer screening: We reviewed recommendations for pap smear screening.   Skin cancer screening: Check your skin regularly for new changes, growing lesions, or other lesions of concern Come in for evaluation if you  have skin lesions of concern.  Lung cancer screening: If you have a greater than 20 pack year history of tobacco use, then you may qualify for lung cancer screening with a chest CT scan.   Please call your insurance company to inquire about coverage for this test.  We currently don't have screenings for other cancers besides breast, cervical, colon, and lung cancers.  If you have a strong family history of cancer or have other cancer screening concerns, please let me know.    Bone health: Get at least 150 minutes of aerobic exercise weekly Get weight bearing exercise at least once weekly Bone density test:  A bone density test is an imaging test that uses a type of X-ray to measure the amount of calcium and other minerals in your bones. The test may be used to diagnose or screen you for a condition that causes weak or thin bones (osteoporosis), predict your risk for a broken bone  (fracture), or determine how well your osteoporosis treatment is working. The bone density test is recommended for females 65 and older, or females or males <65 if certain risk factors such as thyroid disease, long term use of steroids such as for asthma or rheumatological issues, vitamin D deficiency, estrogen deficiency, family history of osteoporosis, self or family history of fragility fracture in first degree relative.    Heart health: Get at least 150 minutes of aerobic exercise weekly Limit alcohol It is important to maintain a healthy blood pressure and healthy cholesterol numbers  Heart disease screening: Screening for heart disease includes screening for blood pressure, fasting lipids, glucose/diabetes screening, BMI height to weight ratio, reviewed of smoking status, physical activity, and diet.    Goals include blood pressure 120/80 or less, maintaining a healthy lipid/cholesterol profile, preventing diabetes or keeping diabetes numbers under good control, not smoking or using tobacco products, exercising most days per week or at least 150 minutes per week of exercise, and eating healthy variety of fruits and vegetables, healthy oils, and avoiding unhealthy food choices like fried food, fast food, high sugar and high cholesterol foods.    Other tests may possibly include EKG test, CT coronary calcium score, echocardiogram, exercise treadmill stress test.   I reviewed recent cardiology notes from 01/2022    Medical care options: I recommend you continue to seek care here first for routine care.  We try really hard to have available appointments Monday through Friday daytime hours for sick visits, acute visits, and physicals.  Urgent care should be used for after hours and weekends for significant issues that cannot wait till the next day.  The emergency department should be used for significant potentially life-threatening emergencies.  The emergency department is expensive, can often  have long wait times for less significant concerns, so try to utilize primary care, urgent care, or telemedicine when possible to avoid unnecessary trips to the emergency department.  Virtual visits and telemedicine have been introduced since the pandemic started in 2020, and can be convenient ways to receive medical care.  We offer virtual appointments as well to assist you in a variety of options to seek medical care.   Separate significant issues discussed: Dyspnea - similar to prior but last year had cardiac eval, no issues of concerns.  No recent asthma flares.  Likely GERD related.  Work on losing weight, avoiding GERD triggers, begin trial of PPI.  If not improving in 2-3 weeks, can refer to GI  BMI 30+  Work on efforts to  lose weight Limit calories to 1500--1600 daily Get moderate exercise 5 or more days per week    Emily Mason was seen today for annual exam.  Diagnoses and all orders for this visit:  Encounter for health maintenance examination in adult -     Lipid panel -     CBC with Differential/Platelet -     Iron, TIBC and Ferritin Panel -     Comprehensive metabolic panel -     TSH -     Hemoglobin A1c  Impaired fasting blood sugar -     Hemoglobin A1c  Screening for diabetes mellitus -     Hemoglobin A1c  History of anemia -     Iron, TIBC and Ferritin Panel  Generalized anxiety disorder  Moderate persistent extrinsic asthma without complication  Screening for lipid disorders -     Lipid panel  Vaccine counseling  Obesity, Class I, BMI 30.0-34.9 (see actual BMI) -     TSH -     Hemoglobin A1c  Other orders -     omeprazole (PRILOSEC) 40 MG capsule; Take 1 capsule (40 mg total) by mouth daily. -     PROAIR DIGIHALER 108 (90 Base) MCG/ACT AEPB; Inhale 2 puffs into the lungs every 6 (six) hours.   Follow-up pending labs, yearly for physical

## 2023-02-27 ENCOUNTER — Other Ambulatory Visit: Payer: Self-pay | Admitting: Medical

## 2023-02-27 LAB — CBC WITH DIFFERENTIAL/PLATELET
Basophils Absolute: 0 10*3/uL (ref 0.0–0.2)
Basos: 1 %
EOS (ABSOLUTE): 0.1 10*3/uL (ref 0.0–0.4)
Eos: 1 %
Hematocrit: 41.3 % (ref 34.0–46.6)
Hemoglobin: 12.9 g/dL (ref 11.1–15.9)
Immature Grans (Abs): 0 10*3/uL (ref 0.0–0.1)
Immature Granulocytes: 0 %
Lymphocytes Absolute: 1.6 10*3/uL (ref 0.7–3.1)
Lymphs: 30 %
MCH: 24.3 pg — ABNORMAL LOW (ref 26.6–33.0)
MCHC: 31.2 g/dL — ABNORMAL LOW (ref 31.5–35.7)
MCV: 78 fL — ABNORMAL LOW (ref 79–97)
Monocytes Absolute: 0.3 10*3/uL (ref 0.1–0.9)
Monocytes: 6 %
Neutrophils Absolute: 3.4 10*3/uL (ref 1.4–7.0)
Neutrophils: 62 %
Platelets: 257 10*3/uL (ref 150–450)
RBC: 5.31 x10E6/uL — ABNORMAL HIGH (ref 3.77–5.28)
RDW: 14.5 % (ref 11.7–15.4)
WBC: 5.5 10*3/uL (ref 3.4–10.8)

## 2023-02-27 LAB — COMPREHENSIVE METABOLIC PANEL
ALT: 19 IU/L (ref 0–32)
AST: 20 IU/L (ref 0–40)
Albumin: 4.4 g/dL (ref 3.9–4.9)
Alkaline Phosphatase: 80 IU/L (ref 44–121)
BUN/Creatinine Ratio: 32 — ABNORMAL HIGH (ref 9–23)
BUN: 20 mg/dL (ref 6–20)
Bilirubin Total: 0.5 mg/dL (ref 0.0–1.2)
CO2: 23 mmol/L (ref 20–29)
Calcium: 9.1 mg/dL (ref 8.7–10.2)
Chloride: 103 mmol/L (ref 96–106)
Creatinine, Ser: 0.63 mg/dL (ref 0.57–1.00)
Globulin, Total: 3.3 g/dL (ref 1.5–4.5)
Glucose: 99 mg/dL (ref 70–99)
Potassium: 4.7 mmol/L (ref 3.5–5.2)
Sodium: 139 mmol/L (ref 134–144)
Total Protein: 7.7 g/dL (ref 6.0–8.5)
eGFR: 119 mL/min/{1.73_m2} (ref >59)

## 2023-02-27 LAB — HEMOGLOBIN A1C
Est. average glucose Bld gHb Est-mCnc: 131 mg/dL
Hgb A1c MFr Bld: 6.2 % — ABNORMAL HIGH (ref 4.8–5.6)

## 2023-02-27 LAB — TSH: TSH: 1.97 u[IU]/mL (ref 0.450–4.500)

## 2023-02-27 LAB — IRON,TIBC AND FERRITIN PANEL
Ferritin: 191 ng/mL — ABNORMAL HIGH (ref 15–150)
Iron Saturation: 26 % (ref 15–55)
Iron: 95 ug/dL (ref 27–159)
Total Iron Binding Capacity: 362 ug/dL (ref 250–450)
UIBC: 267 ug/dL (ref 131–425)

## 2023-02-27 LAB — LIPID PANEL
Chol/HDL Ratio: 3.4 ratio (ref 0.0–4.4)
Cholesterol, Total: 161 mg/dL (ref 100–199)
HDL: 48 mg/dL (ref >39)
LDL Chol Calc (NIH): 99 mg/dL (ref 0–99)
Triglycerides: 70 mg/dL (ref 0–149)
VLDL Cholesterol Cal: 14 mg/dL (ref 5–40)

## 2023-02-27 MED ORDER — ALBUTEROL SULFATE HFA 108 (90 BASE) MCG/ACT IN AERS: 2.0000 | INHALATION_SPRAY | Freq: Four times a day (QID) | RESPIRATORY_TRACT | 2 refills | Status: DC | PRN

## 2023-02-27 NOTE — Progress Notes (Signed)
Results sent through MyChart

## 2023-03-25 ENCOUNTER — Encounter: Payer: Self-pay | Admitting: Internal Medicine

## 2023-04-02 ENCOUNTER — Encounter: Payer: Self-pay | Admitting: Medical

## 2023-04-02 ENCOUNTER — Ambulatory Visit: Payer: BLUE CROSS/BLUE SHIELD | Admitting: Medical

## 2023-04-02 VITALS — BP 110/68 | HR 71 | Temp 98.1°F | Wt 203.8 lb

## 2023-04-02 DIAGNOSIS — T63441A Toxic effect of venom of bees, accidental (unintentional), initial encounter: Secondary | ICD-10-CM

## 2023-04-02 MED ORDER — FAMOTIDINE 40 MG PO TABS: 40.0000 mg | ORAL_TABLET | Freq: Two times a day (BID) | ORAL | 0 refills | Status: DC

## 2023-04-02 MED ORDER — PREDNISONE 10 MG PO TABS: ORAL_TABLET | ORAL | 0 refills | Status: DC

## 2023-04-02 MED ORDER — CEPHALEXIN 500 MG PO CAPS: 500.0000 mg | ORAL_CAPSULE | Freq: Three times a day (TID) | ORAL | 0 refills | Status: DC

## 2023-04-02 NOTE — Progress Notes (Signed)
Subjective:  Emily Mason is a 37 y.o. female who presents for Chief Complaint  Patient presents with   Insect Bite    Insect bite Tuesday night on left leg and has gotten bigger, tingling, throbbing, itchy and hot to touch     Here for bee sting.  2 days ago she was walking in her neighborhood and immediately several bees came at her and once stung her on the leg.  She did not see the bee specifically but felt a painful sting.  She had some swelling as gradually got worse over the last 2 days.  She has done benadryl every 6 hours, ice, elevation of the leg.  The area still seems to be getting worse so she went to come in and get checked out.  No fever, no bodyaches or chills.  She has had swollen areas from bee stings in the past but this is the worst. No other aggravating or relieving factors.    No other c/o.  The following portions of the patient's history were reviewed and updated as appropriate: allergies, current medications, past family history, past medical history, past social history, past surgical history and problem list.  ROS Otherwise as in subjective above  Objective: BP 110/68   Pulse 71   Temp 98.1 F (36.7 C)   Wt 203 lb 12.8 oz (92.4 kg)   BMI 33.91 kg/m   General appearance: alert, no distress, well developed, well nourished Left lateral thigh just proximal to the knee with a 9 cm x 11 cm area of whealed lesion and erythema but no warmth induration or fluctuance.  There is a central bite mark without any obvious foreign body   Assessment: Encounter Diagnosis  Name Primary?   Bee sting, accidental or unintentional, initial encounter Yes     Plan: We discussed the bee sting which is most likely yellowjacket.  We discussed potential complications.  We discussed typical treatment and usual timeframe to see improvement.  Patient Instructions  Recommendations: Begin famotidine Pepcid twice daily for the next few days for allergic reaction Begin prednisone  steroid taper for allergic reaction Continue Benadryl either a half or a whole tablet up to 4 times a day.  You can do up to 50 mg tonight and no more than 25 mg in the daytime.  Caution as this can make you sleepy Elevate the leg, use cool pack for inflammation If worse signs of infection over the next few days such as worse pain, worse redness, hard to touch, hot or warm feeling, fever or other then begin Keflex antibiotic There is no reason to suspect infection at this point based on today's exam In the future if you get stung again start Benadryl right away along with cool pack and elevation.     Emily Mason was seen today for insect bite.  Diagnoses and all orders for this visit:  Bee sting, accidental or unintentional, initial encounter  Other orders -     cephALEXin (KEFLEX) 500 MG capsule; Take 1 capsule (500 mg total) by mouth 3 (three) times daily. -     predniSONE (DELTASONE) 10 MG tablet; 6 tablets all together day 1, 5 tablets day 2, 4 tablets day 3, 3 tablets day 4, 2 tablets day 5, 1 tablet day 6. -     famotidine (PEPCID) 40 MG tablet; Take 1 tablet (40 mg total) by mouth 2 (two) times daily.    Follow up: prn

## 2023-04-02 NOTE — Patient Instructions (Signed)
Recommendations: Begin famotidine Pepcid twice daily for the next few days for allergic reaction Begin prednisone steroid taper for allergic reaction Continue Benadryl either a half or a whole tablet up to 4 times a day.  You can do up to 50 mg tonight and no more than 25 mg in the daytime.  Caution as this can make you sleepy Elevate the leg, use cool pack for inflammation If worse signs of infection over the next few days such as worse pain, worse redness, hard to touch, hot or warm feeling, fever or other then begin Keflex antibiotic There is no reason to suspect infection at this point based on today's exam In the future if you get stung again start Benadryl right away along with cool pack and elevation.

## 2023-04-21 ENCOUNTER — Encounter: Payer: BLUE CROSS/BLUE SHIELD | Admitting: Medical

## 2023-10-12 ENCOUNTER — Telehealth: Payer: Self-pay | Admitting: *Deleted

## 2023-10-12 NOTE — Telephone Encounter (Signed)
 Copied from CRM [id number]. Topic: General - Other >> Oct 12, 2023  3:20 PM Everette C wrote: Reason for CRM: The patient has called to request a letter for work accommodations for the plantar fascitis concerns and work shoe requirements   Please contact the patient further when possible  Patient called asking for a note for work for accommodations. She is having to wear leather shoes at work and she needs to wear her [employer]aton w/ thick soles. At her last CPE she states you told her that these would be ok and gave her specific recommendations. Asking for letter for work.

## 2023-10-13 NOTE — Telephone Encounter (Signed)
 Spoke with patient. 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 She would like it to day maybe something like: Patient needs to wear athletic type shoes such as [employer], [employer], etc brands which help to provide better support while performing her work duities due to her diagnosis of plantar fasciitis.

## 2023-10-14 ENCOUNTER — Encounter: Payer: Self-pay | Admitting: Medical

## 2023-10-14 ENCOUNTER — Other Ambulatory Visit: Payer: Self-pay | Admitting: Medical

## 2023-12-29 ENCOUNTER — Ambulatory Visit: Admission: EM | Admit: 2023-12-29 | Discharge: 2023-12-29 | Disposition: A | Discharge status: Home / Self Care

## 2023-12-29 ENCOUNTER — Encounter: Payer: Self-pay | Admitting: Emergency Medicine

## 2023-12-29 ENCOUNTER — Ambulatory Visit: Payer: Self-pay

## 2023-12-29 DIAGNOSIS — S30860A Insect bite (nonvenomous) of lower back and pelvis, initial encounter: Secondary | ICD-10-CM

## 2023-12-29 DIAGNOSIS — W57XXXA Bitten or stung by nonvenomous insect and other nonvenomous arthropods, initial encounter: Secondary | ICD-10-CM | POA: Diagnosis not present

## 2023-12-29 NOTE — ED Triage Notes (Signed)
 Pt present after she was bit by a tick on right butt check.   Denies pain but has bump where tick was

## 2023-12-29 NOTE — ED Provider Notes (Signed)
 Emily Mason    CSN: [id number] Arrival date & time: 12/29/23  1906      History   Chief Complaint Chief Complaint  Patient presents with   Insect Bite    HPI Emily Mason is a 38 y.o. female.   Patient presents to urgent care for evaluation of tick bite to the right buttock that she first noticed last night. She was able to have her sister remove the tick from her right lower buttock quickly after realizing what was there and states the area has been slightly red and itchy ever since. She is unsure of how long the tick was on her skin and states she was able to retreive the tick in its entirety. Denies fever, chills, nausea, vomiting, diarrhea, abdominal pain, body aches. She is otherwise asymptomatic.      Past Medical History:  Diagnosis Date   Asthma    GERD (gastroesophageal reflux disease)    History of COVID-19    2021 and 2022   Iron deficiency anemia    UTI (lower urinary tract infection)     Patient Active Problem List   Diagnosis Date Noted   Screening for lipid disorders 02/26/2023   Generalized anxiety disorder 03/27/2022   Extrinsic asthma 03/27/2022   Impaired fasting blood sugar 03/27/2022   Encounter for health maintenance examination in adult 02/20/2022   Need for pneumococcal vaccination 02/20/2022   Vaccine counseling 02/20/2022   Screening for diabetes mellitus 02/20/2022   History of anemia 02/20/2022   Plantar fasciitis 02/20/2022   Foot pain, left 02/20/2022   Obesity (BMI 30-39.9) 07/10/2020   Adjustment disorder with anxiety 07/10/2020   Postpartum hemorrhage 11/03/2017    Past Surgical History:  Procedure Laterality Date   CHOLECYSTECTOMY N/A 02/21/2016   Procedure: LAPAROSCOPIC CHOLECYSTECTOMY WITH INTRAOPERATIVE CHOLANGIOGRAM;  Surgeon: Boyce Byes, MD;  Location: [facility];  Service: General;  Laterality: N/A;   DILATION AND CURETTAGE OF UTERUS N/A 11/02/2017   Procedure: DILATATION AND CURETTAGE;  Surgeon: Artemisa Bile,  MD;  Location: [facility];  Service: Gynecology;  Laterality: N/A;   WISDOM TOOTH EXTRACTION      OB History     Gravida  5   Para  3   Term  3   Preterm      AB  2   Living  3      SAB  2   IAB      Ectopic      Multiple  0   Live Births  3            Home Medications    Prior to Admission medications   Medication Sig Start Date End Date Taking? Authorizing Provider  albuterol  (VENTOLIN  HFA) 108 (90 Base) MCG/ACT inhaler Inhale 2 puffs into the lungs every 6 (six) hours as needed for wheezing or shortness of breath. 02/27/23   Tysinger, Christiane Cowing, PA-C  cephALEXin  (KEFLEX ) 500 MG capsule Take 1 capsule (500 mg total) by mouth 3 (three) times daily. Patient not taking: Reported on 12/29/2023 04/02/23   Tysinger, Christiane Cowing, PA-C  cetirizine (ZYRTEC) 10 MG tablet Take 10 mg by mouth daily.    [provider]  famotidine  (PEPCID ) 40 MG tablet Take 1 tablet (40 mg total) by mouth 2 (two) times daily. 04/02/23   Tysinger, Christiane Cowing, PA-C  Levonorgestrel (MIRENA, 52 MG, IU) by Intrauterine route.    [provider]  omeprazole  (PRILOSEC) 40 MG capsule Take 1 capsule (40 mg total)  by mouth daily. 02/26/23   Tysinger, Christiane Cowing, PA-C  predniSONE  (DELTASONE ) 10 MG tablet 6 tablets all together day 1, 5 tablets day 2, 4 tablets day 3, 3 tablets day 4, 2 tablets day 5, 1 tablet day 6. Patient not taking: Reported on 12/29/2023 04/02/23   Tysinger, Christiane Cowing, PA-C    Family History Family History  Problem Relation Age of Onset   Gout Brother    Asthma Neg Hx    Cancer Neg Hx    Diabetes Neg Hx    Heart disease Neg Hx    Hyperlipidemia Neg Hx    Hypertension Neg Hx    Stroke Neg Hx     Social History Social History   Tobacco Use   Smoking status: Never   Smokeless tobacco: Never  Substance Use Topics   Alcohol use: Not Currently    Comment: social    Drug use: No     Allergies   Patient has no known allergies.   Review of Systems Review of  Systems Per HPI  Physical Exam Triage Vital Signs ED Triage Vitals  Encounter Vitals Group     BP 12/29/23 1924 (!) 148/82     Systolic BP Percentile --      Diastolic BP Percentile --      Pulse Rate 12/29/23 1924 94     Resp 12/29/23 1924 16     Temp 12/29/23 1924 98.1 F (36.7 C)     Temp Source 12/29/23 1924 Oral     SpO2 12/29/23 1924 97 %     Weight --      Height --      Head Circumference --      Peak Flow --      Pain Score 12/29/23 1928 0     Pain Loc --      Pain Education --      Exclude from Growth Chart --    No data found.  Updated Vital Signs BP (!) 148/82 (BP Location: Right Arm)   Pulse 94   Temp 98.1 F (36.7 C) (Oral)   Resp 16   SpO2 97%   Visual Acuity Right Eye Distance:   Left Eye Distance:   Bilateral Distance:    Right Eye Near:   Left Eye Near:    Bilateral Near:     Physical Exam Vitals and nursing note reviewed.  Constitutional:      Appearance: She is not ill-appearing or toxic-appearing.  HENT:     Head: Normocephalic and atraumatic.     Right Ear: Hearing and external ear normal.     Left Ear: Hearing and external ear normal.     Nose: Nose normal.     Mouth/Throat:     Lips: Pink.  Eyes:     General: Lids are normal. Vision grossly intact. Gaze aligned appropriately.     Extraocular Movements: Extraocular movements intact.     Conjunctiva/sclera: Conjunctivae normal.  Pulmonary:     Effort: Pulmonary effort is normal.  Musculoskeletal:     Cervical back: Neck supple.  Skin:    General: Skin is warm and dry.     Capillary Refill: Capillary refill takes less than 2 seconds.     Findings: Lesion present. No rash.       Neurological:     General: No focal deficit present.     Mental Status: She is alert and oriented to person, place, and time. Mental status is at baseline.  Cranial Nerves: No dysarthria or facial asymmetry.  Psychiatric:        Mood and Affect: Mood normal.        Speech: Speech normal.         Behavior: Behavior normal.        Thought Content: Thought content normal.        Judgment: Judgment normal.      Mason Treatments / Results  Labs (all labs ordered are listed, but only abnormal results are displayed) Labs Reviewed - No data to display  EKG   Radiology No results found.  Procedures Procedures (including critical care time)  Medications Ordered in Mason Medications - No data to display  Initial Impression / Assessment and Plan / Mason Course  I have reviewed the triage vital signs and the nursing notes.  Pertinent labs & imaging results that were available during my care of the patient were reviewed by me and considered in my medical decision making (see chart for details).   1. Tick bite of buttock No signs of bullseye lesion/cellulitis. Shared decision making used to defer testing for lyme disease and RMSF.  Advised to watch for worsening signs of infection/bulls eye lesion and return for antibiotic if noticed. She may use OTC hydrocortisone cream PRN for itch to the lesion.  Counseled patient on potential for adverse effects with medications prescribed/recommended today, strict ER and return-to-clinic precautions discussed, patient verbalized understanding.    Patient left without receiving AVS.  Final Clinical Impressions(s) / Mason Diagnoses   Final diagnoses:  Tick bite of buttock, initial encounter   Discharge Instructions   None    ED Prescriptions   None    PDMP not reviewed this encounter.   Starlene Eaton, [place] 12/31/23 2115

## 2023-12-29 NOTE — Telephone Encounter (Signed)
 Copied from CRM [id number]. Topic: Clinical - Red Word Triage >> Dec 29, 2023  4:44 PM Rachelle R wrote: Red Word that prompted transfer to Nurse Triage: Patient states on Friday she had a tick crawling on her arm. Yesterday she found one on her right lower butt cheek. Tick (lonestar) has been removed but the is a raised bump that feels like a pimple.  Chief Complaint: Tick bite Symptoms: Red raised area, headache Frequency: Since Friday Pertinent Negatives: Patient denies fever Disposition: [] ED /[x] Urgent Care (no appt availability in office) / [x] Appointment(In office/virtual)/ []  [facility]/ [] Home Care/ [] Refused Recommended Disposition /[] [facility]/ []  Follow-up with PCP Additional Notes: Patient called in to report a tick bite on her right buttock. Patient stated she discovered the tick last night, but it's possible it had been attached since Friday. Patient stated area is red and raised. Patient stated she has been experiencing a dull headache since Friday. Patient denied fever and widespread rash. Advised patient to be seen within 24 hours, per protocol. No availability in office. Advised UC. Patient complied and stated she would go. Provided care advice and instructed patient to call back if symptoms worsen. Patient complied.   Reason for Disposition  [1] 2 to 14 days following tick bite AND [2] widespread rash or headache AND [3] no fever  Answer Assessment - Initial Assessment Questions 1. ATTACHED:  "Is the tick still on the skin?"  (e.g., yes, no, unsure)     No 3. ONSET - TICK NOT STILL ATTACHED: "If the tick has been removed, how long do you think the tick was attached before you removed it?" (e.g., 5 hours, 2 days). "When was this?"     Removed last night 4. LOCATION: "Where is the tick bite located?" (e.g., arm, leg)     Right lower butt cheek 5. TYPE of TICK: "Is it a wood tick or a deer tick?" (e.g., deer tick, wood tick; unsure)     Dana Duncan- has  white dot on back  7. ENGORGED: "Did the tick look flat or engorged (full, swollen)?" (e.g., flat, engorged; unsure)     "Well fed"  8. OTHER SYMPTOMS: "Do you have any other symptoms?" (e.g., fever, rash, redness at bite area, red ring around bite)     Red and raised like a pimple, denies fever, denies red ring around bite, denies drainage, denies pain  First noticed tick on Friday, dull headache since Friday  Protocols used: Tick Bite-A-AH

## 2024-03-03 ENCOUNTER — Ambulatory Visit: Payer: BLUE CROSS/BLUE SHIELD | Admitting: Medical

## 2024-03-03 ENCOUNTER — Encounter: Payer: Self-pay | Admitting: Medical

## 2024-03-03 VITALS — BP 116/68 | HR 88 | Ht 63.5 in | Wt 203.8 lb

## 2024-03-03 DIAGNOSIS — Z0000 Encounter for general adult medical examination without abnormal findings: Secondary | ICD-10-CM

## 2024-03-03 DIAGNOSIS — R7303 Prediabetes: Secondary | ICD-10-CM

## 2024-03-03 DIAGNOSIS — J4540 Moderate persistent asthma, uncomplicated: Secondary | ICD-10-CM

## 2024-03-03 DIAGNOSIS — R718 Other abnormality of red blood cells: Secondary | ICD-10-CM

## 2024-03-03 DIAGNOSIS — W57XXXD Bitten or stung by nonvenomous insect and other nonvenomous arthropods, subsequent encounter: Secondary | ICD-10-CM | POA: Diagnosis not present

## 2024-03-03 DIAGNOSIS — R7301 Impaired fasting glucose: Secondary | ICD-10-CM | POA: Diagnosis not present

## 2024-03-03 DIAGNOSIS — Z13220 Encounter for screening for lipoid disorders: Secondary | ICD-10-CM | POA: Diagnosis not present

## 2024-03-03 DIAGNOSIS — Z91018 Allergy to other foods: Secondary | ICD-10-CM | POA: Diagnosis not present

## 2024-03-03 LAB — LIPID PANEL

## 2024-03-03 MED ORDER — ALBUTEROL SULFATE HFA 108 (90 BASE) MCG/ACT IN AERS
2.0000 | INHALATION_SPRAY | Freq: Four times a day (QID) | RESPIRATORY_TRACT | 2 refills | Status: DC | PRN
Start: 2024-03-03 — End: 2024-05-12

## 2024-03-03 NOTE — Progress Notes (Signed)
 Subjective:   HPI  Emily Mason is a 38 y.o. female who presents for Chief Complaint  Patient presents with   Annual Exam    Fasting- has had a reaction from beef    Patient Care Team: Desmon Hitchner, Alm GORMAN RIGGERS as PCP - General (Family Medicine) Ob/Gyn, [facility] (Obstetrics and Gynecology) Sees dentist Sees eye doctor Dr. Lynwood Schilling, cardiology [facility], Orie Bonus, CNM   Concerns: Has been having problems with meat.   Enjoys red meat but get sick feeling when eating meat.  Had one episode in recent weeks where late at night she felt short of breath he thinks is related to allergy to red meat.  She did get bit by a [place] tick back in May.  She has had issues with red meat since then.  Not really exercising currently.  Gynecological history: 5 pregnancies, 3 live births, 2 miscarriages.   Periods are light with Mirena.     Reviewed their medical, surgical, family, social, medication, and allergy history and updated chart as appropriate.  Past Medical History:  Diagnosis Date   Asthma    GERD (gastroesophageal reflux disease)    History of COVID-19    2021 and 2022   Iron deficiency anemia    UTI (lower urinary tract infection)     Family History  Problem Relation Age of Onset   Gout Brother    Asthma Neg Hx    Cancer Neg Hx    Diabetes Neg Hx    Heart disease Neg Hx    Hyperlipidemia Neg Hx    Hypertension Neg Hx    Stroke Neg Hx      Current Outpatient Medications:    Levonorgestrel (MIRENA, 52 MG, IU), by Intrauterine route., Disp: , Rfl:    albuterol  (VENTOLIN  HFA) 108 (90 Base) MCG/ACT inhaler, Inhale 2 puffs into the lungs every 6 (six) hours as needed for wheezing or shortness of breath., Disp: 8 g, Rfl: 2   famotidine  (PEPCID ) 40 MG tablet, Take 1 tablet (40 mg total) by mouth 2 (two) times daily. (Patient not taking: Reported on 03/03/2024), Disp: 30 tablet, Rfl: 0   omeprazole  (PRILOSEC) 40 MG capsule, Take 1 capsule (40  mg total) by mouth daily. (Patient not taking: Reported on 03/03/2024), Disp: 30 capsule, Rfl: 1  No Known Allergies   Review of Systems  Constitutional:  Negative for chills, fever, malaise/fatigue and weight loss.  HENT:  Negative for congestion, ear pain, hearing loss, sore throat and tinnitus.   Eyes:  Negative for blurred vision, pain and redness.  Respiratory:  Positive for shortness of breath. Negative for cough and hemoptysis.   Cardiovascular:  Negative for chest pain, palpitations, orthopnea, claudication and leg swelling.  Gastrointestinal:  Positive for abdominal pain. Negative for blood in stool, constipation, diarrhea, nausea and vomiting.  Genitourinary:  Negative for dysuria, flank pain, frequency, hematuria and urgency.  Musculoskeletal:  Negative for falls, joint pain and myalgias.  Skin:  Negative for itching and rash.  Neurological:  Negative for dizziness, tingling, speech change, weakness and headaches.  Endo/Heme/Allergies:  Negative for polydipsia. Does not bruise/bleed easily.  Psychiatric/Behavioral:  Negative for depression and memory loss. The patient is not nervous/anxious and does not have insomnia.         03/03/2024    8:21 AM 02/26/2023    8:22 AM 09/19/2022   10:34 AM 05/14/2022    9:14 AM 02/20/2022    3:36 PM  Depression screen PHQ 2/9  Decreased Interest 0 0 0 0 0  Down, Depressed, Hopeless 0 0 0 0 0  PHQ - 2 Score 0 0 0 0 0       Objective:  BP 116/68   Pulse 88   Ht 5' 3.5 (1.613 m)   Wt 203 lb 12.8 oz (92.4 kg)   LMP 02/15/2024   SpO2 97%   BMI 35.54 kg/m   Wt Readings from Last 3 Encounters:  03/03/24 203 lb 12.8 oz (92.4 kg)  04/02/23 203 lb 12.8 oz (92.4 kg)  02/26/23 204 lb 12.8 oz (92.9 kg)   General appearance: alert, no distress, WD/WN, Asian female Skin: unremarkable, tattoo left medial forearm, tattoo behind right ear HEENT: normocephalic, conjunctiva/corneas normal, sclerae anicteric, PERRLA, EOMi, nares patent, no  discharge or erythema, pharynx normal Oral cavity: MMM, tongue normal, teeth normal Neck: supple, no lymphadenopathy, no thyromegaly, no masses, normal ROM, no bruits Chest: non tender, normal shape and expansion Heart: RRR, normal S1, S2, no murmurs Lungs: CTA bilaterally, no wheezes, rhonchi, or rales Abdomen: +bs, soft, non tender, non distended, no masses, no hepatomegaly, no splenomegaly, no bruits Back: non tender, normal ROM, no scoliosis Musculoskeletal: upper extremities non tender, no obvious deformity, normal ROM throughout, lower extremities non tender, no obvious deformity, normal ROM throughout Extremities: no edema, no cyanosis, no clubbing Pulses: 2+ symmetric, upper and lower extremities, normal cap refill Neurological: alert, oriented x 3, CN2-12 intact, strength normal upper extremities and lower extremities, sensation normal throughout, DTRs 2+ throughout, no cerebellar signs, gait normal Psychiatric: normal affect, behavior normal, pleasant  Breast/gyn/rectal - deferred to gynecology     Assessment and Plan :   Encounter Diagnoses  Name Primary?   Encounter for health maintenance examination in adult Yes   Screening for lipid disorders    Moderate persistent extrinsic asthma without complication    Tick bite, unspecified site, subsequent encounter    Allergy to meat    Prediabetes    Abnormal erythrocyte indices      This visit was a preventative care visit, also known as wellness visit or routine physical.   Topics typically include healthy lifestyle, diet, exercise, preventative care, vaccinations, sick and well care, proper use of emergency dept and after hours care, as well as other concerns.     Recommendations: Continue to return yearly for your annual wellness and preventative care visits.  This gives us  a chance to discuss healthy lifestyle, exercise, vaccinations, review your chart record, and perform screenings where appropriate.  I recommend you  see your eye doctor yearly for routine vision care.  I recommend you see your dentist yearly for routine dental care including hygiene visits twice yearly.  See your gynecologist yearly for routine gynecological care.    Vaccination recommendations were reviewed Immunization History  Administered Date(s) Administered   Pneumococcal Polysaccharide-23 02/20/2022   Tdap 03/12/2015   Tetanus Immune Globulin 08/12/2017   Advised a yearly flu shot You will be due for tetanus shot next year 2026   Screening for cancer: Colon cancer screening: Age 52  Breast cancer screening: You should perform a self breast exam monthly.   We reviewed recommendations for regular mammograms and breast cancer screening.  Cervical cancer screening: We reviewed recommendations for pap smear screening.   Skin cancer screening: Check your skin regularly for new changes, growing lesions, or other lesions of concern Come in for evaluation if you have skin lesions of concern.  Lung cancer screening: If you have a greater than 20  pack year history of tobacco use, then you may qualify for lung cancer screening with a chest CT scan.   Please call your insurance company to inquire about coverage for this test.  We currently don't have screenings for other cancers besides breast, cervical, colon, and lung cancers.  If you have a strong family history of cancer or have other cancer screening concerns, please let me know.    Bone health: Get at least 150 minutes of aerobic exercise weekly Get weight bearing exercise at least once weekly Bone density test:  A bone density test is an imaging test that uses a type of X-ray to measure the amount of calcium and other minerals in your bones. The test may be used to diagnose or screen you for a condition that causes weak or thin bones (osteoporosis), predict your risk for a broken bone (fracture), or determine how well your osteoporosis treatment is working. The bone  density test is recommended for females 65 and older, or females or males <65 if certain risk factors such as thyroid  disease, long term use of steroids such as for asthma or rheumatological issues, vitamin D deficiency, estrogen deficiency, family history of osteoporosis, self or family history of fragility fracture in first degree relative.    Heart health: Get at least 150 minutes of aerobic exercise weekly Limit alcohol It is important to maintain a healthy blood pressure and healthy cholesterol numbers  Heart disease screening: Screening for heart disease includes screening for blood pressure, fasting lipids, glucose/diabetes screening, BMI height to weight ratio, reviewed of smoking status, physical activity, and diet.    Goals include blood pressure 120/80 or less, maintaining a healthy lipid/cholesterol profile, preventing diabetes or keeping diabetes numbers under good control, not smoking or using tobacco products, exercising most days per week or at least 150 minutes per week of exercise, and eating healthy variety of fruits and vegetables, healthy oils, and avoiding unhealthy food choices like fried food, fast food, high sugar and high cholesterol foods.    Other tests may possibly include EKG test, CT coronary calcium score, echocardiogram, exercise treadmill stress test.   I reviewed recent cardiology notes from 01/2022    Medical care options: I recommend you continue to seek care here first for routine care.  We try really hard to have available appointments Monday through Friday daytime hours for sick visits, acute visits, and physicals.  Urgent care should be used for after hours and weekends for significant issues that cannot wait till the next day.  The emergency department should be used for significant potentially life-threatening emergencies.  The emergency department is expensive, can often have long wait times for less significant concerns, so try to utilize primary care,  urgent care, or telemedicine when possible to avoid unnecessary trips to the emergency department.  Virtual visits and telemedicine have been introduced since the pandemic started in 2020, and can be convenient ways to receive medical care.  We offer virtual appointments as well to assist you in a variety of options to seek medical care.   Separate significant issues discussed: We discussed her problems with red meat currently.  We will check some alpha gal and beef allergy labs today  Asthma-continue albuterol  as needed   Dyspnea - similar to prior but last year had cardiac eval, no issues of concerns.  No recent asthma flares.  Likely GERD related.  Work on losing weight, avoiding GERD triggers, begin trial of PPI.  If not improving in 2-3 weeks, can  refer to GI  BMI 30+  Work on efforts to lose weight Limit calories to 1500--1600 daily Get moderate exercise 5 or more days per week  Prediabetes-updated labs today.  Counseled on diet and exercise   Anea was seen today for annual exam.  Diagnoses and all orders for this visit:  Encounter for health maintenance examination in adult -     Comprehensive metabolic panel with GFR -     CBC -     Lipid panel -     Hemoglobin A1c -     Folate -     Vitamin B12 -     Beef IgE -     Allergen, Pork, f26 -     Alpha-Gal Panel  Screening for lipid disorders -     Lipid panel  Moderate persistent extrinsic asthma without complication  Tick bite, unspecified site, subsequent encounter -     Alpha-Gal Panel  Allergy to meat -     Beef IgE -     Allergen, Pork, f26 -     Alpha-Gal Panel  Prediabetes -     Hemoglobin A1c  Abnormal erythrocyte indices -     CBC -     Folate -     Vitamin B12  Other orders -     albuterol  (VENTOLIN  HFA) 108 (90 Base) MCG/ACT inhaler; Inhale 2 puffs into the lungs every 6 (six) hours as needed for wheezing or shortness of breath.   Follow-up pending labs, yearly for physical

## 2024-03-03 NOTE — Addendum Note (Signed)
 Addended by: BULAH ALM RAMAN on: 03/03/2024 11:47 AM   Modules accepted: Orders

## 2024-03-04 ENCOUNTER — Ambulatory Visit: Payer: Self-pay | Admitting: Medical

## 2024-03-04 ENCOUNTER — Other Ambulatory Visit: Payer: Self-pay | Admitting: Medical

## 2024-03-04 DIAGNOSIS — Z0182 Encounter for allergy testing: Secondary | ICD-10-CM

## 2024-03-04 DIAGNOSIS — Z91018 Allergy to other foods: Secondary | ICD-10-CM

## 2024-03-04 NOTE — Progress Notes (Signed)
 Results sent through MyChart

## 2024-03-06 LAB — CBC
Hematocrit: 45 % (ref 34.0–46.6)
Hemoglobin: 14.2 g/dL (ref 11.1–15.9)
MCH: 25.9 pg — ABNORMAL LOW (ref 26.6–33.0)
MCHC: 31.6 g/dL (ref 31.5–35.7)
MCV: 82 fL (ref 79–97)
Platelets: 268 x10E3/uL (ref 150–450)
RBC: 5.49 x10E6/uL — ABNORMAL HIGH (ref 3.77–5.28)
RDW: 14.1 % (ref 11.7–15.4)
WBC: 5.4 x10E3/uL (ref 3.4–10.8)

## 2024-03-06 LAB — COMPREHENSIVE METABOLIC PANEL WITH GFR
ALT: 25 IU/L (ref 0–32)
AST: 19 IU/L (ref 0–40)
Albumin: 4.6 g/dL (ref 3.9–4.9)
Alkaline Phosphatase: 86 IU/L (ref 44–121)
BUN/Creatinine Ratio: 15 (ref 9–23)
BUN: 10 mg/dL (ref 6–20)
Bilirubin Total: 0.5 mg/dL (ref 0.0–1.2)
CO2: 21 mmol/L (ref 20–29)
Calcium: 9.4 mg/dL (ref 8.7–10.2)
Chloride: 100 mmol/L (ref 96–106)
Creatinine, Ser: 0.68 mg/dL (ref 0.57–1.00)
Globulin, Total: 3.6 g/dL (ref 1.5–4.5)
Glucose: 111 mg/dL — ABNORMAL HIGH (ref 70–99)
Potassium: 4.7 mmol/L (ref 3.5–5.2)
Sodium: 136 mmol/L (ref 134–144)
Total Protein: 8.2 g/dL (ref 6.0–8.5)
eGFR: 116 mL/min/1.73 (ref >59)

## 2024-03-06 LAB — HEMOGLOBIN A1C
Est. average glucose Bld gHb Est-mCnc: 131 mg/dL
Hgb A1c MFr Bld: 6.2 % — ABNORMAL HIGH (ref 4.8–5.6)

## 2024-03-06 LAB — LIPID PANEL
Chol/HDL Ratio: 3.4 ratio (ref 0.0–4.4)
Cholesterol, Total: 137 mg/dL (ref 100–199)
HDL: 40 mg/dL (ref >39)
LDL Chol Calc (NIH): 81 mg/dL (ref 0–99)
Triglycerides: 81 mg/dL (ref 0–149)
VLDL Cholesterol Cal: 16 mg/dL (ref 5–40)

## 2024-03-06 LAB — VITAMIN B12: Vitamin B-12: 356 pg/mL (ref 232–1245)

## 2024-03-06 LAB — ALLERGEN BEEF: Beef IgE: 0.19 kU/L — AB

## 2024-03-06 LAB — FOLATE: Folate: 18.1 ng/mL (ref >3.0)

## 2024-03-06 LAB — ALLERGEN, PORK, F26: Pork IgE: <0.1 kU/L

## 2024-03-10 LAB — INSULIN, RANDOM: INSULIN: 19.8 u[IU]/mL (ref 2.6–24.9)

## 2024-03-10 LAB — SPECIMEN STATUS REPORT

## 2024-03-10 NOTE — Progress Notes (Signed)
 Check on the alpha gal test

## 2024-03-23 ENCOUNTER — Emergency Department (HOSPITAL_BASED_OUTPATIENT_CLINIC_OR_DEPARTMENT_OTHER)
Admission: EM | Admit: 2024-03-23 | Discharge: 2024-03-23 | Disposition: A | Discharge status: Home / Self Care | Attending: Emergency Medicine | Admitting: Emergency Medicine

## 2024-03-23 ENCOUNTER — Emergency Department (HOSPITAL_BASED_OUTPATIENT_CLINIC_OR_DEPARTMENT_OTHER)

## 2024-03-23 ENCOUNTER — Encounter (HOSPITAL_BASED_OUTPATIENT_CLINIC_OR_DEPARTMENT_OTHER): Payer: Self-pay

## 2024-03-23 ENCOUNTER — Other Ambulatory Visit: Payer: Self-pay

## 2024-03-23 DIAGNOSIS — R55 Syncope and collapse: Secondary | ICD-10-CM | POA: Diagnosis not present

## 2024-03-23 DIAGNOSIS — R42 Dizziness and giddiness: Secondary | ICD-10-CM | POA: Diagnosis not present

## 2024-03-23 DIAGNOSIS — M542 Cervicalgia: Secondary | ICD-10-CM | POA: Insufficient documentation

## 2024-03-23 DIAGNOSIS — G4453 Primary thunderclap headache: Secondary | ICD-10-CM | POA: Diagnosis not present

## 2024-03-23 DIAGNOSIS — R519 Headache, unspecified: Secondary | ICD-10-CM | POA: Diagnosis not present

## 2024-03-23 LAB — PREGNANCY, URINE: Preg Test, Ur: NEGATIVE

## 2024-03-23 LAB — CBC
HCT: 42.4 % (ref 36.0–46.0)
Hemoglobin: 14 g/dL (ref 12.0–15.0)
MCH: 25.5 pg — ABNORMAL LOW (ref 26.0–34.0)
MCHC: 33 g/dL (ref 30.0–36.0)
MCV: 77.1 fL — ABNORMAL LOW (ref 80.0–100.0)
Platelets: 266 K/uL (ref 150–400)
RBC: 5.5 MIL/uL — ABNORMAL HIGH (ref 3.87–5.11)
RDW: 14.1 % (ref 11.5–15.5)
WBC: 6.6 K/uL (ref 4.0–10.5)
nRBC: 0 % (ref 0.0–0.2)

## 2024-03-23 LAB — BASIC METABOLIC PANEL WITH GFR
Anion gap: 10 (ref 5–15)
BUN: 19 mg/dL (ref 6–20)
CO2: 26 mmol/L (ref 22–32)
Calcium: 9.6 mg/dL (ref 8.9–10.3)
Chloride: 100 mmol/L (ref 98–111)
Creatinine, Ser: 0.71 mg/dL (ref 0.44–1.00)
GFR, Estimated: >60 mL/min (ref >60)
Glucose, Bld: 139 mg/dL — ABNORMAL HIGH (ref 70–99)
Potassium: 4.8 mmol/L (ref 3.5–5.1)
Sodium: 136 mmol/L (ref 135–145)

## 2024-03-23 LAB — URINALYSIS, ROUTINE W REFLEX MICROSCOPIC
Bilirubin Urine: NEGATIVE
Glucose, UA: NEGATIVE mg/dL
Hgb urine dipstick: NEGATIVE
Ketones, ur: NEGATIVE mg/dL
Nitrite: NEGATIVE
Protein, ur: 30 mg/dL — AB
Specific Gravity, Urine: 1.02 (ref 1.005–1.030)
pH: 7 (ref 5.0–8.0)

## 2024-03-23 LAB — URINALYSIS, MICROSCOPIC (REFLEX)

## 2024-03-23 LAB — CBG MONITORING, ED: Glucose-Capillary: 146 mg/dL — ABNORMAL HIGH (ref 70–99)

## 2024-03-23 MED ORDER — DIPHENHYDRAMINE HCL 50 MG/ML IJ SOLN
12.5000 mg | Freq: Once | INTRAMUSCULAR | Status: AC
  Administered 2024-03-23 (×2): 12.5 mg via INTRAVENOUS
  Filled 2024-03-23: qty 1

## 2024-03-23 MED ORDER — DEXAMETHASONE SODIUM PHOSPHATE 10 MG/ML IJ SOLN
10.0000 mg | Freq: Once | INTRAMUSCULAR | Status: AC
  Administered 2024-03-23 (×2): 10 mg via INTRAVENOUS
  Filled 2024-03-23: qty 1

## 2024-03-23 MED ORDER — KETOROLAC TROMETHAMINE 15 MG/ML IJ SOLN
15.0000 mg | Freq: Once | INTRAMUSCULAR | Status: AC
  Administered 2024-03-23 (×2): 15 mg via INTRAVENOUS
  Filled 2024-03-23: qty 1

## 2024-03-23 MED ORDER — IOHEXOL 350 MG/ML SOLN
100.0000 mL | Freq: Once | INTRAVENOUS | Status: AC | PRN
  Administered 2024-03-23 (×2): 75 mL via INTRAVENOUS

## 2024-03-23 MED ORDER — METOCLOPRAMIDE HCL 5 MG/ML IJ SOLN
10.0000 mg | Freq: Once | INTRAMUSCULAR | Status: AC
  Administered 2024-03-23 (×2): 10 mg via INTRAVENOUS
  Filled 2024-03-23: qty 2

## 2024-03-23 MED ORDER — ACETAMINOPHEN 500 MG PO TABS
1000.0000 mg | ORAL_TABLET | Freq: Once | ORAL | Status: DC
  Filled 2024-03-23: qty 2

## 2024-03-23 NOTE — ED Triage Notes (Signed)
 Pt reports that she had and episode of LOC last night. Stated that she was sitting, started sweating and has LOC. Was evaluated by EMS and was told it was a panic attack.  Went to work this AM and started feeling dizziness and just not well

## 2024-03-23 NOTE — ED Notes (Signed)
 Pt alert and oriented X 4 at the time of discharge. RR even and unlabored. No acute distress noted. Pt verbalized understanding of discharge instructions as discussed. Pt ambulatory to lobby at time of discharge.

## 2024-03-23 NOTE — ED Provider Notes (Signed)
 [facility] Provider Note   CSN: [id number] Arrival date & time: 03/23/24  1018     Patient presents with: Dizziness   Emily Mason is a 38 y.o. female.   HPI   38 year old female presenting to the emergency department after an episode of loss of consciousness last night.  The patient states that she was seated at the table and developed a thunderclap headache with sudden onset maximal headache, she developed diaphoresis and then experience syncope.  No seizure-like activity, this was witnessed, no focality, no tongue biting or urinary incontinence.  She subsequently came to and was back at her baseline.  She endorsed persistent headache located in a bandlike pattern across her forehead radiating deeper into her skull with associated neck discomfort when she went to work this morning and was feeling unwell with dizziness and decided to present to the emergency department for further evaluation.  Reportedly EMS was called out to her house and she states that she did not inform them that she was having headache but had informed them that she had had an episode of syncope.  She was told it was a panic attack.  The episode happened last night around 9 PM. She denies any neck rigidity, she denies any visual deficit.  She denies any fevers or chills.  Prior to Admission medications   Medication Sig Start Date End Date Taking? Authorizing Provider  albuterol (VENTOLIN HFA) 108 (90 Base) MCG/ACT inhaler Inhale 2 puffs into the lungs every 6 (six) hours as needed for wheezing or shortness of breath. 03/03/24   Tysinger, Alm RAMAN, PA-C  Levonorgestrel (MIRENA, 52 MG, IU) by Intrauterine route.    [provider]    Allergies: Patient has no known allergies.    Review of Systems  Constitutional:  Negative for chills and fever.  Eyes:  Positive for photophobia. Negative for visual disturbance.  Musculoskeletal:  Positive for neck pain. Negative  for neck stiffness.  Neurological:  Positive for syncope and headaches.  All other systems reviewed and are negative.   Updated Vital Signs BP (!) 137/99   Pulse 62   Temp 98.5 F (36.9 C) (Oral)   Resp 17   LMP 02/15/2024   SpO2 98%   Physical Exam Vitals and nursing note reviewed.  Constitutional:      General: She is not in acute distress.    Appearance: She is well-developed.  HENT:     Head: Normocephalic and atraumatic.  Eyes:     Conjunctiva/sclera: Conjunctivae normal.  Neck:     Comments: No meningismus, passive range of motion of the neck without pain Cardiovascular:     Rate and Rhythm: Normal rate and regular rhythm.     Heart sounds: No murmur heard. Pulmonary:     Effort: Pulmonary effort is normal. No respiratory distress.     Breath sounds: Normal breath sounds.  Abdominal:     Palpations: Abdomen is soft.     Tenderness: There is no abdominal tenderness.  Musculoskeletal:        General: No swelling.     Cervical back: Full passive range of motion without pain and neck supple.  Skin:    General: Skin is warm and dry.     Capillary Refill: Capillary refill takes less than 2 seconds.  Neurological:     Mental Status: She is alert.     Comments: MENTAL STATUS EXAM:    Orientation: Alert and oriented to person, place  and time.  Memory: Cooperative, follows commands well.  Language: Speech is clear and language is normal.   CRANIAL NERVES:    CN 2 (Optic): Visual fields intact to confrontation.  CN 3,4,6 (EOM): Pupils equal and reactive to light. Full extraocular eye movement without nystagmus.  CN 5 (Trigeminal): Facial sensation is normal, no weakness of masticatory muscles.  CN 7 (Facial): No facial weakness or asymmetry.  CN 8 (Auditory): Auditory acuity grossly normal.  CN 9,10 (Glossophar): The uvula is midline, the palate elevates symmetrically.  CN 11 (spinal access): Normal sternocleidomastoid and trapezius strength.  CN 12 (Hypoglossal):  The tongue is midline. No atrophy or fasciculations.[name]   MOTOR:  Muscle Strength: 5/5RUE, 5/5LUE, 5/5RLE, 5/5LLE.   COORDINATION:   Intact finger-to-nose, no tremor.   SENSATION:   Intact to light touch all four extremities.  GAIT: Gait normal without ataxia   Psychiatric:        Mood and Affect: Mood normal.     (all labs ordered are listed, but only abnormal results are displayed) Labs Reviewed  BASIC METABOLIC PANEL WITH GFR - Abnormal; Notable for the following components:      Result Value   Glucose, Bld 139 (*)    All other components within normal limits  CBC - Abnormal; Notable for the following components:   RBC 5.50 (*)    MCV 77.1 (*)    MCH 25.5 (*)    All other components within normal limits  URINALYSIS, ROUTINE W REFLEX MICROSCOPIC - Abnormal; Notable for the following components:   APPearance HAZY (*)    Protein, ur 30 (*)    Leukocytes,Ua MODERATE (*)    All other components within normal limits  URINALYSIS, MICROSCOPIC (REFLEX) - Abnormal; Notable for the following components:   Bacteria, UA FEW (*)    All other components within normal limits  CBG MONITORING, ED - Abnormal; Notable for the following components:   Glucose-Capillary 146 (*)    All other components within normal limits  PREGNANCY, URINE    EKG: EKG Interpretation Date/Time:  Wednesday March 23 2024 10:30:41 EDT Ventricular Rate:  81 PR Interval:  147 QRS Duration:  77 QT Interval:  362 QTC Calculation: 421 R Axis:   56  Text Interpretation: Sinus rhythm Confirmed by Jerrol Agent ([id number]) on 03/23/2024 10:37:14 AM  Radiology: CT ANGIO HEAD NECK W WO CM Result Date: 03/23/2024 CLINICAL DATA:  38 year old female with dizziness, episode of loss of consciousness. EXAM: CT ANGIOGRAPHY HEAD AND NECK WITH AND WITHOUT CONTRAST TECHNIQUE: Multidetector CT imaging of the head and neck was performed using the standard protocol during bolus administration of intravenous contrast. Multiplanar CT  image reconstructions and MIPs were obtained to evaluate the vascular anatomy. Carotid stenosis measurements (when applicable) are obtained utilizing NASCET criteria, using the distal internal carotid diameter as the denominator. RADIATION DOSE REDUCTION: This exam was performed according to the departmental dose-optimization program which includes automated exposure control, adjustment of the mA and/or kV according to patient size and/or use of iterative reconstruction technique. CONTRAST:  75mL OMNIPAQUE  IOHEXOL  350 MG/ML SOLN COMPARISON:  Chest CTA 01/05/2022. FINDINGS: CT HEAD Brain: Cerebral volume is within normal limits. No midline shift, ventriculomegaly, mass effect, evidence of mass lesion, intracranial hemorrhage or evidence of cortically based acute infarction. Gray-white matter differentiation is within normal limits throughout the brain. Calvarium and skull base: Intact, negative. Paranasal sinuses: Visualized paranasal sinuses and mastoids are clear. Orbits: Visualized orbits and scalp soft tissues are within normal limits. CTA NECK  Skeleton: Congenital incomplete ossification of the posterior C1 ring, normal variant. No acute osseous abnormality identified. Upper chest: Negative. Other neck: Nonvascular neck soft tissue spaces are within normal limits. Aortic arch: Normal 3 vessel arch. Right carotid system: Patent and normal. Left carotid system: Normal. Vertebral arteries: Proximal right subclavian artery and right vertebral artery origin are normal. Right vertebral artery is patent and appears normal to the skull base. Proximal left subclavian artery and left vertebral artery origin are normal. Codominant left vertebral arteries patent and normal to the skull base. CTA HEAD Posterior circulation: Distal vertebral arteries and vertebrobasilar junction are patent and normal. Normal PICA origins. Right AICA may be dominant. Patent basilar artery without stenosis. Patent SCA and PCA origins. Small  right posterior communicating artery, the left is diminutive or absent. Bilateral PCA branches are within normal limits. Anterior circulation: Both ICA siphons are patent. No siphon plaque or stenosis. Early right cavernous sinus enhancement is felt to be physiologic. Patent carotid termini. Normal MCA and ACA origins. Diminutive or absent anterior communicating artery. Bilateral ACA branches are within normal limits. Left MCA M1 segment and bifurcation are patent without stenosis. Right MCA M1 segment and bifurcation are patent without stenosis. Bilateral MCA branches are within normal limits. Venous sinuses: Patent superior sagittal sinus, transverse and sigmoid sinuses, IJ bulbs. Anatomic variants: None significant. Review of the MIP images confirms the above findings IMPRESSION: Normal CTA Head and Neck. Normal CT appearance of the brain. Electronically Signed   By: VEAR Hurst M.D.   On: 03/23/2024 12:13   DG Chest 2 View Result Date: 03/23/2024 CLINICAL DATA:  Syncope.  Episode of loss of consciousness. EXAM: CHEST - 2 VIEW COMPARISON:  08/14/2020. FINDINGS: Bilateral lung fields are clear. Bilateral costophrenic angles are clear. Normal cardio-mediastinal silhouette. No acute osseous abnormalities. The soft tissues are within normal limits. There are surgical clips in the right upper quadrant, typical of a previous cholecystectomy. IMPRESSION: No active cardiopulmonary disease. Electronically Signed   By: Ree Molt M.D.   On: 03/23/2024 12:04     Procedures   Medications Ordered in the ED  acetaminophen  (TYLENOL ) tablet 1,000 mg (1,000 mg Oral Not Given 03/23/24 1111)  metoCLOPramide  (REGLAN ) injection 10 mg (10 mg Intravenous Given 03/23/24 1107)  diphenhydrAMINE  (BENADRYL ) injection 12.5 mg (12.5 mg Intravenous Given 03/23/24 1106)  iohexol  (OMNIPAQUE ) 350 MG/ML injection 100 mL (75 mLs Intravenous Contrast Given 03/23/24 1150)                                    Medical Decision  Making Amount and/or Complexity of Data Reviewed Labs: ordered. Radiology: ordered.  Risk OTC drugs. Prescription drug management.    38 year old female presenting to the emergency department after an episode of loss of consciousness last night.  The patient states that she was seated at the table and developed a thunderclap headache with sudden onset maximal headache, she developed diaphoresis and then experience syncope.  No seizure-like activity, this was witnessed, no focality, no tongue biting or urinary incontinence.  She subsequently came to and was back at her baseline.  She endorsed persistent headache located in a bandlike pattern across her forehead radiating deeper into her skull with associated neck discomfort when she went to work this morning and was feeling unwell with dizziness and decided to present to the emergency department for further evaluation.  Reportedly EMS was called out to her house and she  states that she did not inform them that she was having headache but had informed them that she had had an episode of syncope.  She was told it was a panic attack.  The episode happened last night around 9 PM. She denies any neck rigidity, she denies any visual deficit.  She denies any fevers or chills.  On arrival, the patient was afebrile, not tachycardic heart rate 74, RR 19, BP 147/89, saturating 99% on room air.  Patient on exam had a normal neurologic exam, no meningismus.  She presents after a thunderclap headache and subsequent syncope.  She endorses nausea, light sensitivity.  She endorses neck discomfort, denies any fevers.  No vomiting.  No chest pain or shortness of breath.  I discussed with the patient the concern for thunderclap headache and subsequent syncope with associated nausea and neck discomfort.  This raises concern for potential subarachnoid hemorrhage.  Additionally considered migraine headache.  Very low concern for meningitis patient is afebrile without  meningismus on exam.  Discussed diagnostic testing options.  Unfortunately the patient is outside the 6-hour window for plain CT head to evaluate for subarachnoid hemorrhage.  Discussed lumbar puncture as the gold standard diagnostic test versus the less invasive CT angiogram which is still a good test to evaluate for subarachnoid hemorrhage.  The patient would prefer to start with CT angiogram of the head and neck to further evaluate which was ordered.  IV access was obtained and the patient was administered a migraine cocktail.   EKG: Sinus rhythm, ventricular rate 81, no abnormal intervals or acute ischemic changes.  Labs: CBC without a leukocytosis or anemia, BMP unremarkable, CBG 146, urinalysis with negative hemoglobin, negative nitrates, moderate leukocytes, 11-20 WBCs and few bacteria, squamous cells present, urine pregnancy negative.  Patient without urinary symptoms.  CXR: Negative  CTA Head and Neck: Negative  On repeat assessment, the patient was feeling symptomatically improved.  I had a conversation bedside with the patient regarding lumbar puncture for further diagnostic testing.  After shared decision making, the patient would prefer to defer lumbar puncture at this time.  Provided the patient with return precautions if she develops fever, neck stiffness and worsening headache requiring return to the emergency department, advised that she return to the emerged part for any recurrent thunderclap headache as this could be indicative of subarachnoid hemorrhage or reversible cerebral vasoconstriction syndrome.  Patient symptomatically improved on repeat assessment, comfortable with continued management at home, stable for outpatient follow-up.     Final diagnoses:  Primary thunderclap headache  Syncope, unspecified syncope type    ED Discharge Orders     None          Jerrol Agent, MD 03/23/24 1222

## 2024-03-23 NOTE — Discharge Instructions (Addendum)
 Your CT angiogram was overall reassuring as was your EKG, x-ray and screening labs.  Please follow-up with your primary care provider for continued management, return to the emergency department for any severe worsening symptoms. Recommend Tylenol  and Motrin  for continued HA management.

## 2024-03-24 ENCOUNTER — Ambulatory Visit: Admitting: Family Medicine

## 2024-03-24 ENCOUNTER — Encounter: Payer: Self-pay | Admitting: Family Medicine

## 2024-03-24 VITALS — BP 130/82 | HR 99 | Temp 98.7°F | Wt 204.8 lb

## 2024-03-24 DIAGNOSIS — R6883 Chills (without fever): Secondary | ICD-10-CM

## 2024-03-24 DIAGNOSIS — R42 Dizziness and giddiness: Secondary | ICD-10-CM | POA: Diagnosis not present

## 2024-03-24 DIAGNOSIS — R059 Cough, unspecified: Secondary | ICD-10-CM | POA: Diagnosis not present

## 2024-03-24 DIAGNOSIS — G4453 Primary thunderclap headache: Secondary | ICD-10-CM | POA: Diagnosis not present

## 2024-03-24 LAB — POC COVID19 BINAXNOW: SARS Coronavirus 2 Ag: NEGATIVE

## 2024-03-24 NOTE — Patient Instructions (Signed)
 ACUTE SEVERE HEADACHE WITH SYNCOPE AND VERTIGO: You experienced a sudden severe headache with fainting and dizziness. Your CT angiogram was normal, and there was no sign of bleeding in the brain. -Stay hydrated by drinking plenty of fluids. -Monitor your symptoms and seek re-evaluation if they worsen. -Avoid alcohol and limit caffeine intake.  DEHYDRATION: You showed signs of dehydration, likely due to not drinking enough fluids and sweating. -Increase your fluid intake with non-caffeinated and non-alcoholic beverages. -Monitor the color of your urine to ensure it is clear or pale yellow, which indicates proper hydration.  ACUTE VIRAL UPPER RESPIRATORY INFECTION (SUSPECTED): You have symptoms of a viral infection, including headache, cough, chills, and congestion. Your COVID test was negative, but early testing may not be conclusive. -Stay hydrated to keep your urine clear or pale yellow. -Use Mucinex or Mucinex DM to help with congestion and cough. -Consider using sinus rinses to relieve congestion. -Monitor your symptoms and consider a repeat COVID test if they worsen.

## 2024-03-24 NOTE — Progress Notes (Signed)
 Chief Complaint  Patient presents with   other    Weakness, fatigue, head ache, dizziness, and cold chills, started Tuesday passed out at home called EMS they said anxiety attack, went to work yesterday and got dizzy again, slight cough, running covid test [facility]    Emily Mason is a 38 year old female who presents for f/u from ER visit related to a thunderclap headache and syncope.   On August 12th, she experienced a sudden onset thunderclap headache while sitting at the kitchen table around 9 PM, accompanied by sweating, feeling hot and cold, and syncope. There was no seizure-like activity, tongue biting, or urinary incontinence. Upon regaining consciousness, she returned to her baseline mental status. She was home with her parents and children when this happened. She had eaten a salad at 5pm, went for a walk, and was about to get up to shower (before eating dinner) when this occurred.  Paramedics were called. She recalls that her initial blood sugar was 349, later retested at low 200 range. She states that EMS mentioned something about an anxiety medication with an H (hydroxyzine?), and discussed anxiety and grounding techniques.  Mentioned she may have had a panic attack. She denies taking that medication, prior h/o wellbutrin  use, nothing recent.  The following day, she felt extremely tired.  When she got to work, she noticed recurrent vertigo and nausea. She then developed another strong headache, similar to the prior evening. She left work and went to the ER. Concern was for subarachnoid hemorrhage. CT angiogram showed no abnormalities.She declined lumbar puncture.  She had been treated with benadryl , reglan , dexamethasone  and ketorolac , and she felt better. Blood sugar was 139 in ER, nonfasting. Known to be prediabetic with A1c of 6.2% in 02/2024.  ER studies reviewed CXR normal. CT angio: IMPRESSION: Normal CTA Head and Neck. Normal CT appearance of the brain.  CBC, b-met (other than glu  139) normal, negative pregnancy test. Abnormal urinalysis--SG 1.020, 30 protein, moderate leuks.  Micro--few bacteria, 6-10 squams, 11-20 WBC.   Today she noted feeling weak with prolonged standing (while waiting to check her kids in at the dentist this morning). She felt better with sitting. She hadn't had anything to eat or drink at that time. She has since eaten, and no longer feels weak. Today she also notes onset of a cough, a mild headache--different than the severe ones she had, more frontal, across forehead.  She has had some chills, no known fever. She feels a little congested in her chest No sick contacts.    PMH, PSH, SH reviewed  Outpatient Encounter Medications as of 03/24/2024  Medication Sig   albuterol  (VENTOLIN  HFA) 108 (90 Base) MCG/ACT inhaler Inhale 2 puffs into the lungs every 6 (six) hours as needed for wheezing or shortness of breath.   Levonorgestrel (MIRENA, 52 MG, IU) by Intrauterine route.   No facility-administered encounter medications on file as of 03/24/2024.   No Known Allergies  ROS: feels cold, nauseated, frontal headache, slight cough. Some chest congestion. No fevers. No CP, SOB, bleeding, bruising, rash. Some swelling in LE's last night, denies swelling now. Denies anxiety, depression. No current vertigo, LH/dizziness. No urinary complaints.    PHYSICAL EXAM:  BP 130/82   Pulse 99   Temp 98.7 F (37.1 C)   Wt 204 lb 12.8 oz (92.9 kg)   LMP 02/15/2024   SpO2 99%   BMI 35.71 kg/m   Wt Readings from Last 3 Encounters:  03/24/24 204 lb 12.8 oz (  92.9 kg)  03/03/24 203 lb 12.8 oz (92.4 kg)  04/02/23 203 lb 12.8 oz (92.4 kg)   Wel-appearing, pleasant female in no distress HEENT: conjunctiva and sclera are clear, PERRL, EOMI. TM's and EAC's normal. Nasal mucosa is mildly edematous, no erythema or drainage. Mildly tender at frontal and maxillary sinuses bilaterally.  OP is clear Neck: no lymphadenopathy or mass Heart: regular rate and  rhythm (pulse 80's-90), no murmur Lungs: clear bilaterally Back: no spinal or CVA tenderness Abdomen: soft, nontender, obese, no mass Extremities: no edema Neuro: alert and oriented, cranial nerves intact. Normal strength, sensation. DTR's 1+ and symmetric. Normal gait.  No vertigo with position changes. Psych: normal mood, affect, hygiene and grooming  Negative for COVID-19   ASSESSMENT/PLAN:  Cough, unspecified type - occ dry cough during visit, suspect related to PND. Lungs clear. Supportive measures reviewed - Plan: POC COVID-19 BinaxNow  Chills (without fever) - Plan: POC COVID-19 BinaxNow  Vertigo - this has resolved. No positional component elicited on exam today.  Some more congestion today, poss early viral illness  Primary thunderclap headache - This has resolved. Currently has milder headache, some sinus pain. Had ER evaluation for thunderclap HA, CT angio negative for SAH   Acute severe headache with syncope and vertigo Sudden thunderclap headache with syncope and vertigo. CT angiography normal, no subarachnoid hemorrhage. Symptoms improved with ER treatment. Current symptoms include frontal headache, nausea, mild vertigo. Normal neurologic exam. - Encouraged hydration. - Monitor symptoms, seek re-evaluation if worsened. - Avoid alcohol, limit caffeine.  Dehydration--suspected based on concentrated urine, mild orthostatic symptoms today, very little urine output today - Encouraged increased fluid intake, non-caffeinated and non-alcoholic beverages. - Monitor urine color for adequate hydration.  Acute viral upper respiratory infection (suspected).   Suspected viral infection with frontal headache, cough, chills, congestion. Negative COVID test, early testing may not be conclusive. Consider repeat testing tomorrow if URI symptoms persist/worsen - Encouraged hydration for clear or pale yellow urine. - Use Mucinex  or Mucinex  DM for congestion and cough. - Consider sinus  rinses for congestion. - Monitor symptoms, consider repeat COVID test if worsened.  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  I spent 45 minutes dedicated to the care of this patient, including pre-visit review of records, face to face time, post-visit ordering of testing and documentation.

## 2024-03-31 DIAGNOSIS — Z01419 Encounter for gynecological examination (general) (routine) without abnormal findings: Secondary | ICD-10-CM | POA: Diagnosis not present

## 2024-03-31 DIAGNOSIS — Z124 Encounter for screening for malignant neoplasm of cervix: Secondary | ICD-10-CM | POA: Diagnosis not present

## 2024-05-11 ENCOUNTER — Ambulatory Visit: Payer: Self-pay

## 2024-05-11 NOTE — Telephone Encounter (Signed)
 FYI Only or Action Required?: Action required by provider: request for appointment.  Patient was last seen in primary care on 03/24/2024 by Randol Dawes, MD.  Called Nurse Triage reporting Back Pain.  Symptoms began several days ago.  Interventions attempted: OTC medications: motrin .  Symptoms are: gradually worsening.Worsening back pain.  Triage Disposition: See HCP Within 4 Hours (Or PCP Triage)  Patient/caregiver understands and will follow disposition?:     Copied from CRM #[id number]. Topic: Clinical - Red Word Triage >> May 11, 2024 10:42 AM Carlatta H wrote: [facility] that prompted transfer to Nurse Triage: Patient is having severe lower back pain since Monday// Reason for Disposition  [1] SEVERE back pain (e.g., excruciating, unable to do any normal activities) AND [2] not improved 2 hours after pain medicine  Answer Assessment - Initial Assessment Questions 1. ONSET: When did the pain begin? (e.g., minutes, hours, days)     3 days 2. LOCATION: Where does it hurt? (upper, mid or lower back)     lower 3. SEVERITY: How bad is the pain?  (e.g., Scale 1-10; mild, moderate, or severe)     8-9 4. PATTERN: Is the pain constant? (e.g., yes, no; constant, intermittent)      constant 5. RADIATION: Does the pain shoot into your legs or somewhere else?     o 6. CAUSE:  What do you think is causing the back pain?      unsure 7. BACK OVERUSE:  Any recent lifting of heavy objects, strenuous work or exercise?     no 8. MEDICINES: What have you taken so far for the pain? (e.g., nothing, acetaminophen , NSAIDS)     motrin  9. NEUROLOGIC SYMPTOMS: Do you have any weakness, numbness, or problems with bowel/bladder control?     no 10. OTHER SYMPTOMS: Do you have any other symptoms? (e.g., fever, abdomen pain, burning with urination, blood in urine)       no 11. PREGNANCY: Is there any chance you are pregnant? When was your last menstrual period?       no  Protocols  used: Back Pain-A-AH

## 2024-05-12 ENCOUNTER — Ambulatory Visit: Admitting: Medical

## 2024-05-12 ENCOUNTER — Encounter: Payer: Self-pay | Admitting: Medical

## 2024-05-12 VITALS — BP 118/70 | HR 72 | Temp 97.9°F | Ht 63.5 in | Wt 208.6 lb

## 2024-05-12 DIAGNOSIS — M545 Low back pain, unspecified: Secondary | ICD-10-CM | POA: Diagnosis not present

## 2024-05-12 DIAGNOSIS — M62830 Muscle spasm of back: Secondary | ICD-10-CM

## 2024-05-12 DIAGNOSIS — M461 Sacroiliitis, not elsewhere classified: Secondary | ICD-10-CM | POA: Diagnosis not present

## 2024-05-12 LAB — POCT URINALYSIS DIP (PROADVANTAGE DEVICE)
Bilirubin, UA: NEGATIVE
Glucose, UA: NEGATIVE mg/dL
Ketones, POC UA: NEGATIVE mg/dL
Nitrite, UA: NEGATIVE
Protein Ur, POC: NEGATIVE mg/dL
Specific Gravity, Urine: 1.02
Urobilinogen, Ur: 0.2
pH, UA: 6.5 (ref 5.0–8.0)

## 2024-05-12 MED ORDER — TIZANIDINE HCL 4 MG PO TABS: 4.0000 mg | ORAL_TABLET | Freq: Two times a day (BID) | ORAL | 0 refills | Status: AC | PRN

## 2024-05-12 MED ORDER — ALBUTEROL SULFATE HFA 108 (90 BASE) MCG/ACT IN AERS: 2.0000 | INHALATION_SPRAY | Freq: Four times a day (QID) | RESPIRATORY_TRACT | 2 refills | Status: AC | PRN

## 2024-05-12 NOTE — Progress Notes (Signed)
 Name: Emily Mason   Date of Visit: 05/12/24   Date of last visit with me: 03/03/2024   CHIEF COMPLAINT:  Chief Complaint  Patient presents with   Back Pain    Low back pain, middle since Monday. Has been taking 800mg  ibu BID, Biofreeze and ICY hot.        HPI:  Discussed the use of AI scribe software for clinical note transcription with the patient, who gave verbal consent to proceed.  History of Present Illness  Emily Mason is a 38 year old female who presents with acute lower back pain.  The patient experienced the onset of severe lower back pain on Monday, describing it as 'really bad' and 'stiff'. The pain was most severe on Monday 4 days ago, making it difficult for her to move or turn in bed, and she required assistance from her children to dress. The pain has since improved but remains present.  The pain began while she was in her car, attempting to change her shoes, when she felt a sudden weight drop into her lower back, followed by an immediate spasm. She denies any recent injuries or falls.  She experienced numbness in her right leg, which has since improved. No leg pain, fever, urinary symptoms, or blood in her urine.  For symptom management, she has been using Icy Hot roll-on, Biofreeze patches, and taking 800 mg of ibuprofen  twice daily. She initially tried 400 mg, but it was ineffective. The ibuprofen  helps, but once it wears off, her back becomes stiff again.  She is not currently engaging in regular stretching exercises.  Needs refill on inhaler, didn't get her refill upon last refill, forgot about it.  Past Medical History:  Diagnosis Date   Asthma    GERD (gastroesophageal reflux disease)    History of COVID-19    2021 and 2022   Iron deficiency anemia    UTI (lower urinary tract infection)    Current Outpatient Medications on File Prior to Visit  Medication Sig Dispense Refill   ibuprofen  (ADVIL ) 200 MG tablet Take 800 mg by mouth every 6 (six)  hours as needed.     Levonorgestrel (MIRENA, 52 MG, IU) by Intrauterine route.     No current facility-administered medications on file prior to visit.   ROS as in subjective     Physical Exam BP 118/70   Pulse 72   Temp 97.9 F (36.6 C) (Tympanic)   Ht 5' 3.5 (1.613 m)   Wt 208 lb 9.6 oz (94.6 kg)   BMI 36.37 kg/m   Gen: wd, wn, nad MUSCULOSKELETAL: No midline spinal tenderness. left sacroiliac joint tenderness. No pain with hip movement. 5/5 hip strength bilaterally. Limited range of motion in lumbar spine, stiff appearing Legs normal ROM Legs neurovascularly intact Ext: no edema    Assessment and Plan Encounter Diagnoses  Name Primary?   Low back pain, unspecified back pain laterality, unspecified chronicity, unspecified whether sciatica present Yes   Sacroiliitis    Spasm of back muscles     Left sacroiliitis with low back pain and muscle spasm Acute left sacroiliitis with low back pain and muscle spasm due to inflammation and mechanical strain. Differential includes mechanical back pain and muscle spasm. No UTI signs despite hematuria and white cells, likely menstrual. - Continue ibuprofen  800 mg twice daily (OTC dosing). Consider increasing to three times daily if needed. - Prescribe tizanidine for muscle spasm, use once or twice daily as needed. Discussed potential drowsiness. -  Encourage regular stretching exercises. - Advise core and back strengthening exercises: sit-ups, crunches, back extensions. - Consider x-ray if symptoms recur frequently or persist. Discussed early x-ray for diagnostic clarity. - Educate on proper lifting techniques and body mechanics.  Refilled inhaler prn use   Emily Mason was seen today for back pain.  Diagnoses and all orders for this visit:  Low back pain, unspecified back pain laterality, unspecified chronicity, unspecified whether sciatica present -     POCT Urinalysis DIP (Proadvantage Device)  Sacroiliitis  Spasm of back  muscles  Other orders -     tiZANidine (ZANAFLEX) 4 MG tablet; Take 1 tablet (4 mg total) by mouth 2 (two) times daily as needed for muscle spasms. -     albuterol  (VENTOLIN  HFA) 108 (90 Base) MCG/ACT inhaler; Inhale 2 puffs into the lungs every 6 (six) hours as needed for wheezing or shortness of breath.    F/u prn

## 2025-03-09 ENCOUNTER — Encounter: Payer: Self-pay | Admitting: Medical
